# Patient Record
Sex: Female | Born: 1965 | Race: White | Hispanic: No | Marital: Married | Smoking: Never smoker
Health system: Southern US, Community
[De-identification: ages and names within clinical notes are randomized; demographics above are authoritative.]

## PROBLEM LIST (undated history)

## (undated) DIAGNOSIS — F419 Anxiety disorder, unspecified: Secondary | ICD-10-CM

## (undated) DIAGNOSIS — R21 Rash and other nonspecific skin eruption: Secondary | ICD-10-CM

## (undated) DIAGNOSIS — R519 Headache, unspecified: Secondary | ICD-10-CM

## (undated) DIAGNOSIS — G4700 Insomnia, unspecified: Secondary | ICD-10-CM

## (undated) DIAGNOSIS — M25474 Effusion, right foot: Secondary | ICD-10-CM

## (undated) DIAGNOSIS — K829 Disease of gallbladder, unspecified: Secondary | ICD-10-CM

## (undated) DIAGNOSIS — R079 Chest pain, unspecified: Secondary | ICD-10-CM

## (undated) DIAGNOSIS — F32A Depression, unspecified: Secondary | ICD-10-CM

## (undated) DIAGNOSIS — M25471 Effusion, right ankle: Secondary | ICD-10-CM

## (undated) DIAGNOSIS — F329 Major depressive disorder, single episode, unspecified: Secondary | ICD-10-CM

## (undated) DIAGNOSIS — M2550 Pain in unspecified joint: Secondary | ICD-10-CM

## (undated) DIAGNOSIS — E7800 Pure hypercholesterolemia, unspecified: Secondary | ICD-10-CM

## (undated) DIAGNOSIS — R12 Heartburn: Secondary | ICD-10-CM

## (undated) DIAGNOSIS — L853 Xerosis cutis: Secondary | ICD-10-CM

## (undated) DIAGNOSIS — R5383 Other fatigue: Secondary | ICD-10-CM

## (undated) DIAGNOSIS — M25475 Effusion, left foot: Secondary | ICD-10-CM

## (undated) DIAGNOSIS — R0602 Shortness of breath: Secondary | ICD-10-CM

## (undated) DIAGNOSIS — M25472 Effusion, left ankle: Secondary | ICD-10-CM

## (undated) DIAGNOSIS — R002 Palpitations: Secondary | ICD-10-CM

## (undated) DIAGNOSIS — G4730 Sleep apnea, unspecified: Secondary | ICD-10-CM

## (undated) DIAGNOSIS — M25569 Pain in unspecified knee: Secondary | ICD-10-CM

## (undated) DIAGNOSIS — I10 Essential (primary) hypertension: Secondary | ICD-10-CM

## (undated) DIAGNOSIS — E739 Lactose intolerance, unspecified: Secondary | ICD-10-CM

## (undated) DIAGNOSIS — M545 Low back pain, unspecified: Secondary | ICD-10-CM

## (undated) HISTORY — DX: Major depressive disorder, single episode, unspecified: F32.9

## (undated) HISTORY — DX: Rash and other nonspecific skin eruption: R21

## (undated) HISTORY — DX: Headache, unspecified: R51.9

## (undated) HISTORY — DX: Essential (primary) hypertension: I10

## (undated) HISTORY — DX: Disease of gallbladder, unspecified: K82.9

## (undated) HISTORY — DX: Effusion, left ankle: M25.472

## (undated) HISTORY — DX: Depression, unspecified: F32.A

## (undated) HISTORY — DX: Low back pain, unspecified: M54.50

## (undated) HISTORY — DX: Effusion, left foot: M25.475

## (undated) HISTORY — DX: Other fatigue: R53.83

## (undated) HISTORY — DX: Insomnia, unspecified: G47.00

## (undated) HISTORY — DX: Pure hypercholesterolemia, unspecified: E78.00

## (undated) HISTORY — DX: Palpitations: R00.2

## (undated) HISTORY — DX: Effusion, right ankle: M25.471

## (undated) HISTORY — DX: Anxiety disorder, unspecified: F41.9

## (undated) HISTORY — DX: Pain in unspecified knee: M25.569

## (undated) HISTORY — DX: Effusion, right foot: M25.474

## (undated) HISTORY — DX: Shortness of breath: R06.02

## (undated) HISTORY — DX: Sleep apnea, unspecified: G47.30

## (undated) HISTORY — DX: Xerosis cutis: L85.3

## (undated) HISTORY — DX: Pain in unspecified joint: M25.50

## (undated) HISTORY — DX: Chest pain, unspecified: R07.9

## (undated) HISTORY — DX: Lactose intolerance, unspecified: E73.9

## (undated) HISTORY — DX: Heartburn: R12

---

## 1998-08-29 ENCOUNTER — Inpatient Hospital Stay (HOSPITAL_COMMUNITY): Admission: AD | Admit: 1998-08-29 | Discharge: 1998-08-29 | Payer: Self-pay | Admitting: Obstetrics & Gynecology

## 1998-09-04 ENCOUNTER — Inpatient Hospital Stay (HOSPITAL_COMMUNITY): Admission: AD | Admit: 1998-09-04 | Discharge: 1998-09-04 | Payer: Self-pay | Admitting: Obstetrics & Gynecology

## 1998-09-05 ENCOUNTER — Inpatient Hospital Stay (HOSPITAL_COMMUNITY): Admission: AD | Admit: 1998-09-05 | Discharge: 1998-09-09 | Payer: Self-pay | Admitting: Obstetrics and Gynecology

## 1998-09-05 ENCOUNTER — Encounter: Payer: Self-pay | Admitting: Obstetrics and Gynecology

## 1998-09-11 ENCOUNTER — Encounter (HOSPITAL_COMMUNITY): Admission: RE | Admit: 1998-09-11 | Discharge: 1998-12-10 | Payer: Self-pay | Admitting: Obstetrics & Gynecology

## 1999-07-27 HISTORY — PX: CHOLECYSTECTOMY: SHX55

## 2000-06-03 ENCOUNTER — Encounter (INDEPENDENT_AMBULATORY_CARE_PROVIDER_SITE_OTHER): Payer: Self-pay | Admitting: *Deleted

## 2000-06-03 ENCOUNTER — Ambulatory Visit (HOSPITAL_COMMUNITY): Admission: RE | Admit: 2000-06-03 | Discharge: 2000-06-04 | Payer: Self-pay

## 2001-11-20 ENCOUNTER — Other Ambulatory Visit: Admission: RE | Admit: 2001-11-20 | Discharge: 2001-11-20 | Payer: Self-pay | Admitting: Obstetrics & Gynecology

## 2002-05-24 ENCOUNTER — Encounter (INDEPENDENT_AMBULATORY_CARE_PROVIDER_SITE_OTHER): Payer: Self-pay

## 2002-05-24 ENCOUNTER — Inpatient Hospital Stay (HOSPITAL_COMMUNITY): Admission: AD | Admit: 2002-05-24 | Discharge: 2002-05-28 | Payer: Self-pay | Admitting: Obstetrics & Gynecology

## 2002-05-29 ENCOUNTER — Encounter: Admission: RE | Admit: 2002-05-29 | Discharge: 2002-06-28 | Payer: Self-pay | Admitting: Obstetrics & Gynecology

## 2002-07-03 ENCOUNTER — Other Ambulatory Visit: Admission: RE | Admit: 2002-07-03 | Discharge: 2002-07-03 | Payer: Self-pay | Admitting: Obstetrics & Gynecology

## 2002-12-05 ENCOUNTER — Emergency Department (HOSPITAL_COMMUNITY): Admission: EM | Admit: 2002-12-05 | Discharge: 2002-12-05 | Payer: Self-pay | Admitting: Emergency Medicine

## 2002-12-05 ENCOUNTER — Encounter: Payer: Self-pay | Admitting: Emergency Medicine

## 2004-04-14 ENCOUNTER — Other Ambulatory Visit: Admission: RE | Admit: 2004-04-14 | Discharge: 2004-04-14 | Payer: Self-pay | Admitting: Obstetrics and Gynecology

## 2005-08-22 ENCOUNTER — Encounter: Payer: Self-pay | Admitting: Internal Medicine

## 2005-09-28 ENCOUNTER — Other Ambulatory Visit: Admission: RE | Admit: 2005-09-28 | Discharge: 2005-09-28 | Payer: Self-pay | Admitting: Obstetrics and Gynecology

## 2006-09-07 ENCOUNTER — Encounter: Admission: RE | Admit: 2006-09-07 | Discharge: 2006-09-07 | Payer: Self-pay | Admitting: Internal Medicine

## 2006-09-12 ENCOUNTER — Ambulatory Visit: Payer: Self-pay | Admitting: Internal Medicine

## 2007-01-18 ENCOUNTER — Encounter: Admission: RE | Admit: 2007-01-18 | Discharge: 2007-01-18 | Payer: Self-pay | Admitting: Obstetrics and Gynecology

## 2007-07-26 ENCOUNTER — Encounter: Admission: RE | Admit: 2007-07-26 | Discharge: 2007-07-26 | Payer: Self-pay | Admitting: Internal Medicine

## 2007-09-13 ENCOUNTER — Ambulatory Visit: Payer: Self-pay | Admitting: Internal Medicine

## 2007-10-02 ENCOUNTER — Ambulatory Visit: Payer: Self-pay

## 2008-01-24 ENCOUNTER — Ambulatory Visit: Payer: Self-pay

## 2008-03-15 ENCOUNTER — Encounter: Admission: RE | Admit: 2008-03-15 | Discharge: 2008-03-15 | Payer: Self-pay | Admitting: Obstetrics and Gynecology

## 2008-09-20 DIAGNOSIS — R079 Chest pain, unspecified: Secondary | ICD-10-CM

## 2008-09-24 ENCOUNTER — Ambulatory Visit: Payer: Self-pay | Admitting: Internal Medicine

## 2008-09-24 ENCOUNTER — Encounter: Payer: Self-pay | Admitting: Internal Medicine

## 2008-09-24 DIAGNOSIS — E663 Overweight: Secondary | ICD-10-CM | POA: Insufficient documentation

## 2008-09-24 DIAGNOSIS — R002 Palpitations: Secondary | ICD-10-CM

## 2009-05-28 ENCOUNTER — Encounter: Admission: RE | Admit: 2009-05-28 | Discharge: 2009-05-28 | Payer: Self-pay | Admitting: Internal Medicine

## 2010-12-08 NOTE — Assessment & Plan Note (Signed)
[facility]                            CARDIOLOGY OFFICE NOTE   KISSA, CAMPOY                   MRN:          [id number]  DATE:09/13/2007                            DOB:          10-Feb-1966    PRIMARY CARE PHYSICIAN:  Dr. Creola Corn.   INTERVAL HISTORY:  Megan Bryant is a 45 year old woman with history of  chronic chest pain, obesity and anxiety, as well as borderline  hyperlipidemia who returns for routine follow-up.  She had Myoview in  March 2005 which showed ejection fraction 79% and no ischemia or  infarct.  We last saw her a year ago when she was having some chest  pain.  We talked about various strategies including catheterization  repeat stress test or cardiac CT.  However, chest pain resolved and we  did not pursue it further.  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  She now has recurrent chest pain and throbbing was  sometimes goes down her left arm.  There is no dyspnea but she does feel  very stressed and fatigued, and has had several panic attacks.   CURRENT MEDICATIONS:  None.   PHYSICAL EXAM:  She is mildly anxious but no acute distress ambulates  around the clinic without respiratory difficulty.  Blood pressure is 118/82, heart rate 91 weights 228.  HEENT is normal.  NECK:  Supple.  No J JVD.  Carotid 2+ bilateral bruits.  There is no  lymphadenopathy or thyromegaly.  CARDIAC:  PMI is nondisplaced.  Regular rate and rhythm.  No murmurs,  rubs or gallops.  LUNGS:  Clear.  ABDOMEN:  Obese, nontender, nondistended.  No hepatosplenomegaly, no  bruits, no masses.  Good bowel sounds.  EXTREMITIES:  Warm with no cyanosis, clubbing or edema.  No rash.  NEURO:  Alert and oriented x3.  Cranial nerves II-XII are intact.  Moves  all four extremities without difficulty.  Affect is pleasant.   EKG shows normal sinus rhythm rate of 91 no ST-T  wave abnormalities.   ASSESSMENT/PLAN:  Chest pain this is mostly atypical but she does have a  strong family history and is growing increasing concerned about the  possibility of underlying coronary disease.  I will plan for an exercise  Myoview to further risk stratify.  Further disposition pending results  of her Myoview.    Bevelyn Buckles. Bensimhon, MD  Electronically Signed   DRB/MedQ  DD: 09/13/2007  DT: 09/14/2007  Job #: [id number]   cc:   Gwen Pounds, MD

## 2010-12-11 NOTE — Op Note (Signed)
[facility]  Patient:    Megan Bryant, Megan Bryant                   MRN: [id number] Proc. Date: 06/03/00 Adm. Date:  [id number] Attending:  Meredith Leeds CC:         Jonelle Sports. Cheryll Cockayne, M.D.   Operative Report  PREOPERATIVE DIAGNOSIS:  Cholelithiasis and chronic cholecystitis.  POSTOPERATIVE DIAGNOSIS:  Cholelithiasis and chronic cholecystitis.  PROCEDURE:  Laparoscopic cholecystectomy.  SURGEON:  Zigmund Daniel, M.D.  ASSISTANT:  Abigail Miyamoto, M.D.  ANESTHESIA:  General.  DESCRIPTION OF PROCEDURE:  After adequate anesthesia and monitoring and routine preparation and draping of the abdomen, I made a short transverse infraumbilical incision, dissected down to the fascia, opened it longitudinally, opened the peritoneum bluntly, placed an 0 Vicryl pursestring suture, and secured a Hasson cannula.  After inflating the abdomen with CO2, I examined the abdominal contents and saw no abnormalities.  I placed three additional ports and after positioning the patient head up, foot down, and tilted to the left, I retracted the fundus of the gallbladder upward and the infundibulum of the gallbladder laterally.  I dissected out and clearly identified the cystic duct emerging from the infundibulum of the gallbladder, clipped it with four clips, and cut between the two closer to the gallbladder. I dissected out the cystic artery and similarly clipped and divided it, leaving two clips on the remaining stump.  There was one additional small artery, which was I similarly clipped and divided.  I then used spatula and hook cautery devices to dissect the gallbladder from the liver, getting hemostasis with the cautery.  The gallbladder was removed intact, but one small hole was made in it.  For that reason, I put it in a plastic pouch and removed it through the umbilical incision, then tied the pursestring suture. I then again carefully inspected the  operative area and found that the clips were secure and that hemostasis was excellent.  I copiously irrigated the right upper quadrant and removed the irrigant until it was clear and felt there was no further leakage of bile or bleeding.  I then removed the lateral ports, removed the CO2, and then removed the epigastric port.  I anesthetized all the incisions with 0.5% bupivacaine with epinephrine and closed the skin with intracuticular 4-0 Vicryl and Steri-Strips.  The patient tolerated the operation well. DD:  06/03/00 TD:  06/03/00 Job: [id number]

## 2010-12-11 NOTE — Discharge Summary (Signed)
NAME:  Megan Bryant, Megan Bryant                      ACCOUNT NO.:  [id number]   MEDICAL RECORD NO.:  [id number]                   PATIENT TYPE:  INP   LOCATION:  9115                                 FACILITY:  [facility]   PHYSICIAN:  Julio Sicks, N.P.                  DATE OF BIRTH:  Dec 01, 1965   DATE OF ADMISSION:  05/24/2002  DATE OF DISCHARGE:  05/28/2002                                 DISCHARGE SUMMARY   ADMISSION DIAGNOSES:  1. Intrauterine pregnancy at 36-5/7 weeks estimated gestational age.  2. Spontaneous rupture of membranes.  3. Breech presentation.  4. Surgically scarred uterus.  5. Multiparity, desires sterilization.   DISCHARGE DIAGNOSES:  1. Status post cesarean delivery.  2. Viable female infant.  3. Permanent sterilization.   PROCEDURE:  1. Repeat low transverse cesarean delivery.  2. Bilateral tubal ligation.   REASON FOR ADMISSION:  Please see dictated H&P.   HOSPITAL COURSE:  The patient is a 45 year old white married female, gravida  2, para 1 who was admitted at 36-5/7 weeks' estimated gestational age with  spontaneous rupture of membranes with breech presentation.  The patient had  a previous cesarean delivery.  She was admitted for a repeat cesarean  delivery.  The patient also desired bilateral tubal ligation.   The patient was taken to the operating room where spinal anesthesia was  administered without difficulty.  A low transverse cesarean section was made  with delivery of a viable female infant weighing 5 pounds 7 ounces, Apgars 8  at one minute and 9 at five minutes.  Arterial cord pH was 7.34.  Bilateral  tubal ligation was performed.  The patient tolerated the procedure well and  was taken to the recovery room in stable condition.  On postoperative day  #1, the patient complained of a frontal headache which was relieved with  Percocet.  Vital signs were stable.  Blood pressure 110/76.  The patient's  headache was related to sinus congestion.  Zyrtec was  started.  The patient  had good return of bowel function.  Abdominal dressing was clean, dry and  intact.  Labs revealed hemoglobin of 10.5, platelet count of 244,000, WBC  count of 12.2.  On postoperative day #2, vital signs were stable.  Abdominal  dressing was removed, and the incision was noted be clean, dry and intact.  The patient also complained of a shooting pain in her neck with headache and  neck soreness.  On postoperative day #3, the patient was ambulating well.  She continued to have a headache.  She denied numbness or weakness in her  lower extremities.  The pain was relieved with Motrin and Percocet.  Slight  ecchymosis was noted at the spinal injection site, however, without edema,  induration or increase in warmth.  Anesthesia was in to evaluate the  patient.  On postoperative day #4, the patient stated that headache had  resolved.  She continued  to have some neck soreness.  Abdomen was soft.  Incision was clean, dry and intact.  Staples were removed, and the patient  was discharged home.   DISCHARGE CONDITION:  Good.   DISCHARGE INSTRUCTIONS:  Diet regular as tolerated.  Activity:  No heavy  lifting, no vaginal entry.  No driving x2 weeks.   FOLLOW UP:  The patient is to follow up in the office in one to two weeks.  She is to call for temperature greater than 100 degrees, persistent nausea  and vomiting, heavy vaginal bleeding and/or redness from the incisional  site.   DISCHARGE MEDICATIONS:  1. Percocet 5 mg/325 mg #30 one p.o. every four to six hours p.r.n. pain.  2. Motrin 600 mg every six hours p.r.n.  3. Prenatal vitamins one p.o. daily.  4. Colace one p.o. daily p.r.n.                                               Julio Sicks, N.P.    CC/MEDQ  D:  06/17/2002  T:  06/17/2002  Job:  [id number]

## 2010-12-11 NOTE — Op Note (Signed)
NAME:  Megan Bryant, Megan Bryant                      ACCOUNT NO.:  [id number]   MEDICAL RECORD NO.:  [id number]                   PATIENT TYPE:  INP   LOCATION:  NA                                   FACILITY:  [facility]   PHYSICIAN:  Freddy Finner, M.D.                DATE OF BIRTH:  [birth year]   DATE OF PROCEDURE:  05/24/2002  DATE OF DISCHARGE:                                 OPERATIVE REPORT   PREOPERATIVE DIAGNOSES:  1. Intrauterine pregnancy at 79 and five-sevenths weeks gestation.  2. Spontaneous rupture of membranes.  3. Breech presentation.  4. Surgically scarred uterus.   POSTOPERATIVE DIAGNOSES:  1. Intrauterine pregnancy at 58 and five-sevenths weeks gestation.  2. Spontaneous rupture of membranes.  3. Breech presentation.  4. Surgically scarred uterus.  5. Delivery of viable infant, Apgars of 8 and 9, cord pH 7.34.   SECONDARY DIAGNOSIS:  Multiparity and request for surgical sterilization.   SECONDARY PROCEDURE:  Bilateral tubal ligation.   SURGEON:  Freddy Finner, M.D.   ANESTHESIA:  Spinal.   ESTIMATED INTRAOPERATIVE BLOOD LOSS:  600 cc.   INTRAOPERATIVE COMPLICATIONS:  None.   INDICATIONS FOR PROCEDURE:  The patient is a 45 year old who has been  followed through an essentially uneventful pregnancy.  Her first baby was  breech at term and she delivered by cesarean.  This child is also known to  be breech.  She was scheduled for a repeat cesarean section but presented  with rupture of membranes and onset of contractions.  Abdominal exam  confirmed breech presentation.   DESCRIPTION OF PROCEDURE:  The patient was brought to the operating room,  there placed under adequate spinal anesthesia.  Placed in the dorsal  recumbent position with elevation of the right hip by 15 degrees.  The  abdomen was prepped in the usual fashion.  A Foley catheter was placed using  sterile technique.  Sterile drapes were applied.  A lower abdominal  transverse incision was made through  an old scar and carried sharply down to  the fascia.  The fascia was entered sharply and extended to the extent of  the skin incision.  The rectus sheath was developed superiorly.  The rectus  muscles were divided in the midline.  The peritoneum was entered sharply and  extended bluntly to the extent of the skin incision.  A transverse incision  was made in the visceral peritoneum overlying the lower segment and the  bladder bluntly dissected off the lower segment.  A transverse incision was  made in the lower uterine segment and extended bluntly in a transverse  direction.  A viable female infant was then delivered by breech extraction  without difficulty.  Cord was retained for a routine sampling and arterial  cord blood for gases.  Placenta and other parts of conception were removed  from the uterus.  The uterus was delivered through the abdominal incision  and wrapped  in a moist pack.  It appeared to be normal, as were the tubes  and ovaries.  The uterine incision was then closed with a running locking 0  Monocryl suture.  Hemostasis was noted to be adequate.  The mid portion of  each tube was elevated, doubly ligated with three ties of 0 plain, and a  segment of the tube excised, and the distal mucosal surface of the tube  fulgurated.  The uterus was placed back within the abdominal cavity.  Irrigation was carried out.  Hemostatis again was felt to be adequate and  complete.  Needle, sponge, and instrument counts were correct.  The  abdominal incision was then closed in layers; running 0 Monocryl was used to  close the peritoneum and to reapproximate the rectus muscles.  The fascia  was closed with running 0 PDS.  The subcutaneous was closed with a running  suture of 2-0 plain.  The skin was closed with _____ skin staples and Steri-  Strips.  The patient tolerated the operative procedure well and was taken to  recovery in good condition.                                                Freddy Finner, M.D.    WRN/MEDQ  D:  05/24/2002  T:  05/24/2002  Job:  [id number]

## 2010-12-11 NOTE — Assessment & Plan Note (Signed)
[facility]                            CARDIOLOGY OFFICE NOTE   FELISA, ZECHMAN                   MRN:          [id number]  DATE:09/12/2006                            DOB:          [birth year]    REASON FOR CONSULTATION:  Chest pain.   HISTORY OF PRESENT ILLNESS:  Megan Bryant is a delightful 45 year old  woman with a history of chronic chest pain as well as obesity. She also  states that she has borderline hyperlipidemia, but no hypertension. She  has an extensive family history of coronary artery disease. She tells me  that she has had a several year history of chest pain. In March of 2005,  she underwent a treadmill Cardiolite, which showed ejection fraction of  79%. No evidence of ischemia or infarct. There was some mild breast  attenuation. Subsequent to that, she said the chest pain felt somewhat  better. However, the past year, she has recurrent chest and shoulder  pain. She describes this as a throbbing in her upper left chest and  radiating to her shoulder. It can happen at any time, but she says it is  much worse at night. Occasionally, she has an acid taste in the back of  her throat but this is very rare. There is no change with exertion.  There is no associated nausea, vomiting or shortness of breath or  palpitations. She says that given her family history that she gets very  anxious about it and works herself up and is having a hard time  sleeping. She has not had any significant lower extremity edema except  where she broke her ankle many years ago.   REVIEW OF SYSTEMS:  It is notable for anxiety, fatigue and constipation  as well as allergies. No bright red blood per rectum. No melena. No  fevers. No chills. No nausea or vomiting. No abdominal pain. The  remainder of the review of systems is negative except for as per HPI and  problem list.   PROBLEM LIST:  1. Chest pain.      a.     Exercise Cardiolite in March of 2005,  showed an EF of 79%       with no ischemia or infarct.  2. Headaches.  3. Obesity.  4. Anxiety.   CURRENT MEDICATIONS:  Zyrtec p.r.n.   SOCIAL HISTORY:  She is married with two children, 23 and 59 years old.  She works as an Nature conservation officer at [employer]. She goes to [employer]. She denies any tobacco or alcohol use.   FAMILY HISTORY:  Mother is 33 with some mild dementia. Father is alive  at 73. He had a myocardial infarction at 50. He had stents placed in  [place], [place]. Sister is 68 with no coronary disease. Maternal  uncle had heart disease at age 81 and paternal grandfather had heart  disease at 74 and paternal grandfather had heart disease at 46.   PHYSICAL EXAMINATION:  She is a very pleasant woman in no acute  distress. Ambulates around the clinic without any respiratory  difficulty. Blood pressure was 132/68, heart  rate is 85. Weight is 215.  HEENT: Sclerae anicteric. EOMI. There is no xanthelasma. Mucus membranes  are moist. Oropharynx is clear.  NECK: is supple. No JVD. Carotids are 2+ bilaterally without bruits.  There is no lymphadenopathy, thyromegaly.  CARDIAC: Is a regular rate and a rhythm. No murmurs, rubs or gallops.  LUNGS:  Are clear.  ABDOMEN: Obese, nontender, nondistended. No hepatosplenomegaly. No  bruits. No masses appreciated. Good bowel sounds.  EXTREMITIES: Are warm with no cyanosis, clubbing or edema. There are  good distal pulses. No rashes.  NEURO: She is alert and oriented x3. Cranial nerves II-XII are intact.  Moves all 4 extremities without difficulty. Affect is very pleasant.   EKG shows normal  sinus rhythm at a rate of 85 with no ST-T-wave  abnormalities.   ASSESSMENT/PLAN:  Chest pain. Despite her extensive family history, I do  suspect this is non-cardiac. However, the stress over it is greatly  interfering with the quality of her life and thus I do think we need to  be more definitive about providing her with a diagnosis. We  discussed  the possible options including repeating her stress test or proceeding  with cardiac CT or cardiac catheterization. She would like to think over  these options. I outlined the risks and benefits of each approach in  detail. I also think that there may be a component of gastroesophageal  reflux disease and in the interim we will start her on a proton pump  inhibitor. She will go home and discuss the options with her husband.  Should her pain not be resolved in one week after the proton pump  inhibitor initiation, she will get back to me and we will discuss how  she would like to proceed with her testing.   We will see her back in 1 month for routine followup.     Bevelyn Buckles. Bensimhon, MD  Electronically Signed    DRB/MedQ  DD: 09/12/2006  DT: 09/12/2006  Job #: [id number]   cc:   Gwen Pounds, MD

## 2011-04-21 ENCOUNTER — Other Ambulatory Visit: Payer: Self-pay | Admitting: Internal Medicine

## 2011-04-21 DIAGNOSIS — Z1231 Encounter for screening mammogram for malignant neoplasm of breast: Secondary | ICD-10-CM

## 2011-04-23 ENCOUNTER — Ambulatory Visit
Admission: RE | Admit: 2011-04-23 | Discharge: 2011-04-23 | Disposition: A | Discharge status: Home / Self Care | Payer: Managed Care, Other (non HMO) | Source: Ambulatory Visit | Attending: Internal Medicine | Admitting: Internal Medicine

## 2011-04-23 DIAGNOSIS — Z1231 Encounter for screening mammogram for malignant neoplasm of breast: Secondary | ICD-10-CM

## 2012-11-13 ENCOUNTER — Other Ambulatory Visit: Payer: Self-pay

## 2012-11-13 DIAGNOSIS — Z1231 Encounter for screening mammogram for malignant neoplasm of breast: Secondary | ICD-10-CM

## 2012-12-20 ENCOUNTER — Ambulatory Visit: Payer: Managed Care, Other (non HMO)

## 2013-03-16 ENCOUNTER — Ambulatory Visit
Admission: RE | Admit: 2013-03-16 | Discharge: 2013-03-16 | Disposition: A | Discharge status: Home / Self Care | Payer: Managed Care, Other (non HMO) | Source: Ambulatory Visit

## 2013-03-16 DIAGNOSIS — Z1231 Encounter for screening mammogram for malignant neoplasm of breast: Secondary | ICD-10-CM

## 2014-06-03 ENCOUNTER — Other Ambulatory Visit: Payer: Self-pay

## 2014-06-03 DIAGNOSIS — Z1231 Encounter for screening mammogram for malignant neoplasm of breast: Secondary | ICD-10-CM

## 2014-06-06 ENCOUNTER — Ambulatory Visit
Admission: RE | Admit: 2014-06-06 | Discharge: 2014-06-06 | Disposition: A | Discharge status: Home / Self Care | Payer: Managed Care, Other (non HMO) | Source: Ambulatory Visit

## 2014-06-06 DIAGNOSIS — Z1231 Encounter for screening mammogram for malignant neoplasm of breast: Secondary | ICD-10-CM

## 2014-06-24 ENCOUNTER — Other Ambulatory Visit: Payer: Self-pay | Admitting: Obstetrics and Gynecology

## 2014-06-25 LAB — CYTOLOGY - PAP

## 2015-05-27 ENCOUNTER — Other Ambulatory Visit: Payer: Self-pay

## 2015-05-27 DIAGNOSIS — Z1231 Encounter for screening mammogram for malignant neoplasm of breast: Secondary | ICD-10-CM

## 2015-06-27 ENCOUNTER — Ambulatory Visit
Admission: RE | Admit: 2015-06-27 | Discharge: 2015-06-27 | Disposition: A | Discharge status: Home / Self Care | Payer: BLUE CROSS/BLUE SHIELD | Source: Ambulatory Visit

## 2015-06-27 DIAGNOSIS — Z1231 Encounter for screening mammogram for malignant neoplasm of breast: Secondary | ICD-10-CM

## 2015-12-03 ENCOUNTER — Encounter: Payer: Self-pay | Admitting: Gastroenterology

## 2016-01-23 ENCOUNTER — Ambulatory Visit (AMBULATORY_SURGERY_CENTER): Payer: Self-pay

## 2016-01-23 VITALS — Ht 70.0 in | Wt 222.0 lb

## 2016-01-23 DIAGNOSIS — Z1211 Encounter for screening for malignant neoplasm of colon: Secondary | ICD-10-CM

## 2016-01-23 MED ORDER — NA SULFATE-K SULFATE-MG SULF 17.5-3.13-1.6 GM/177ML PO SOLN: 1.0000 | Freq: Once | ORAL | Status: DC

## 2016-01-23 NOTE — Progress Notes (Signed)
No egg or soy allergy.  No previous complications from anesthesia. No home O2. No diet meds. 

## 2016-01-28 ENCOUNTER — Encounter: Payer: Self-pay | Admitting: Gastroenterology

## 2016-02-06 ENCOUNTER — Encounter: Payer: Self-pay | Admitting: Gastroenterology

## 2016-02-06 ENCOUNTER — Ambulatory Visit (AMBULATORY_SURGERY_CENTER): Payer: Managed Care, Other (non HMO) | Admitting: Gastroenterology

## 2016-02-06 VITALS — BP 120/57 | HR 70 | Temp 98.9°F | Resp 11 | Ht 70.0 in | Wt 222.0 lb

## 2016-02-06 DIAGNOSIS — Z1211 Encounter for screening for malignant neoplasm of colon: Secondary | ICD-10-CM | POA: Diagnosis not present

## 2016-02-06 MED ORDER — SODIUM CHLORIDE 0.9 % IV SOLN: 500.0000 mL | INTRAVENOUS | Status: DC

## 2016-02-06 NOTE — Patient Instructions (Signed)
YOU HAD AN ENDOSCOPIC PROCEDURE TODAY AT THE [facility]:   Refer to the procedure report that was given to you for any specific questions about what was found during the examination.  If the procedure report does not answer your questions, please call your gastroenterologist to clarify.  If you requested that your care partner not be given the details of your procedure findings, then the procedure report has been included in a sealed envelope for you to review at your convenience later.  YOU SHOULD EXPECT: Some feelings of bloating in the abdomen. Passage of more gas than usual.  Walking can help get rid of the air that was put into your GI tract during the procedure and reduce the bloating. If you had a lower endoscopy (such as a colonoscopy or flexible sigmoidoscopy) you may notice spotting of blood in your stool or on the toilet paper. If you underwent a bowel prep for your procedure, you may not have a normal bowel movement for a few days.  Please Note:  You might notice some irritation and congestion in your nose or some drainage.  This is from the oxygen used during your procedure.  There is no need for concern and it should clear up in a day or so.  SYMPTOMS TO REPORT IMMEDIATELY:   Following lower endoscopy (colonoscopy or flexible sigmoidoscopy):  Excessive amounts of blood in the stool  Significant tenderness or worsening of abdominal pains  Swelling of the abdomen that is new, acute  Fever of 100F or higher   For urgent or emergent issues, a gastroenterologist can be reached at any hour by calling [phone].   DIET: Your first meal following the procedure should be a small meal and then it is ok to progress to your normal diet. Heavy or fried foods are harder to digest and may make you feel nauseous or bloated.  Likewise, meals heavy in dairy and vegetables can increase bloating.  Drink plenty of fluids but you should avoid alcoholic beverages for 24  hours.  ACTIVITY:  You should plan to take it easy for the rest of today and you should NOT DRIVE or use heavy machinery until tomorrow (because of the sedation medicines used during the test).    FOLLOW UP: Our staff will call the number listed on your records the next business day following your procedure to check on you and address any questions or concerns that you may have regarding the information given to you following your procedure. If we do not reach you, we will leave a message.  However, if you are feeling well and you are not experiencing any problems, there is no need to return our call.  We will assume that you have returned to your regular daily activities without incident.  If any biopsies were taken you will be contacted by phone or by letter within the next 1-3 weeks.  Please call us at [phone] if you have not heard about the biopsies in 3 weeks.    SIGNATURES/CONFIDENTIALITY: You and/or your care partner have signed paperwork which will be entered into your electronic medical record.  These signatures attest to the fact that that the information above on your After Visit Summary has been reviewed and is understood.  Full responsibility of the confidentiality of this discharge information lies with you and/or your care-partner.  Diverticulosis and high fiber diet information given.  Hemorrhoid and hemorrhoid banding information given.

## 2016-02-06 NOTE — Progress Notes (Signed)
Report to PACU, RN, vss, BBS= Clear.  

## 2016-02-06 NOTE — Progress Notes (Signed)
1045 30 mls mylicon for gas discomfort.

## 2016-02-06 NOTE — Op Note (Signed)
[facility] Patient Name: Megan PhoMichelle Bryant Procedure Date: 02/06/2016 9:50 AM MRN: [id number] Endoscopist: Napoleon FormKavitha V. Wandra Babin , MD Age: 50 Referring MD:  Date of Birth: 10-Mar-1966 Gender: Female Account #: [id number] Procedure:                Colonoscopy Indications:              Screening for colorectal malignant neoplasm, This                            is the patient's first colonoscopy Medicines:                Monitored Anesthesia Care Procedure:                Pre-Anesthesia Assessment:                           - Prior to the procedure, a History and Physical                            was performed, and patient medications and                            allergies were reviewed. The patient's tolerance of                            previous anesthesia was also reviewed. The risks                            and benefits of the procedure and the sedation                            options and risks were discussed with the patient.                            All questions were answered, and informed consent                            was obtained. Prior Anticoagulants: The patient has                            taken no previous anticoagulant or antiplatelet                            agents. ASA Grade Assessment: I - A normal, healthy                            patient. After reviewing the risks and benefits,                            the patient was deemed in satisfactory condition to                            undergo the procedure.  After obtaining informed consent, the colonoscope                            was passed under direct vision. Throughout the                            procedure, the patient's blood pressure, pulse, and                            oxygen saturations were monitored continuously. The                            Model CF-HQ190L [id number]) scope was introduced                            through the anus and advanced  to the the terminal                            ileum, with identification of the appendiceal                            orifice and IC valve. The colonoscopy was performed                            without difficulty. The patient tolerated the                            procedure well. The quality of the bowel                            preparation was excellent. The ileocecal valve,                            appendiceal orifice, and rectum were photographed. Scope In: 10:05:55 AM Scope Out: 10:26:51 AM Scope Withdrawal Time: 0 hours 15 minutes 37 seconds  Total Procedure Duration: 0 hours 20 minutes 56 seconds  Findings:                 The perianal and digital rectal examinations were                            normal.                           A few small-mouthed diverticula were found in the                            sigmoid colon, descending colon and transverse                            colon.                           Non-bleeding internal hemorrhoids were found during  retroflexion. The hemorrhoids were small.                           The exam was otherwise without abnormality. Complications:            No immediate complications. Estimated Blood Loss:     Estimated blood loss: none. Impression:               - Diverticulosis in the sigmoid colon, in the                            descending colon and in the transverse colon.                           - Non-bleeding internal hemorrhoids.                           - The examination was otherwise normal.                           - No specimens collected. Recommendation:           - Patient has a contact number available for                            emergencies. The signs and symptoms of potential                            delayed complications were discussed with the                            patient. Return to normal activities tomorrow.                            Written discharge instructions  were provided to the                            patient.                           - Resume previous diet.                           - Continue present medications.                           - Repeat colonoscopy in 10 years for screening                            purposes.                           - Return to GI clinic PRN. Napoleon Form, MD 02/06/2016 10:31:59 AM This report has been signed electronically.

## 2016-02-09 ENCOUNTER — Telehealth: Payer: Self-pay

## 2016-02-09 NOTE — Telephone Encounter (Signed)
  Follow up Call-  Call back number 02/06/2016  Post procedure Call Back phone  # [phone]  Permission to leave phone message Yes     Patient questions:  Do you have a fever, pain , or abdominal swelling? No. Pain Score  0 *  Have you tolerated food without any problems? Yes.    Have you been able to return to your normal activities? Yes.    Do you have any questions about your discharge instructions: Diet   No. Medications  No. Follow up visit  No.  Do you have questions or concerns about your Care? No.  Actions: * If pain score is 4 or above: No action needed, pain <4.

## 2016-11-23 ENCOUNTER — Other Ambulatory Visit: Payer: Self-pay | Admitting: Internal Medicine

## 2016-11-23 DIAGNOSIS — Z1231 Encounter for screening mammogram for malignant neoplasm of breast: Secondary | ICD-10-CM

## 2016-12-10 ENCOUNTER — Other Ambulatory Visit (HOSPITAL_COMMUNITY): Payer: Self-pay | Admitting: Internal Medicine

## 2016-12-10 ENCOUNTER — Ambulatory Visit: Payer: Managed Care, Other (non HMO)

## 2016-12-10 ENCOUNTER — Other Ambulatory Visit: Payer: Self-pay | Admitting: Internal Medicine

## 2016-12-10 DIAGNOSIS — E786 Lipoprotein deficiency: Secondary | ICD-10-CM

## 2016-12-16 ENCOUNTER — Ambulatory Visit
Admission: RE | Admit: 2016-12-16 | Discharge: 2016-12-16 | Disposition: A | Discharge status: Home / Self Care | Payer: No Typology Code available for payment source | Source: Ambulatory Visit | Attending: Internal Medicine | Admitting: Internal Medicine

## 2016-12-16 DIAGNOSIS — E786 Lipoprotein deficiency: Secondary | ICD-10-CM

## 2017-01-10 ENCOUNTER — Ambulatory Visit
Admission: RE | Admit: 2017-01-10 | Discharge: 2017-01-10 | Disposition: A | Discharge status: Home / Self Care | Payer: Managed Care, Other (non HMO) | Source: Ambulatory Visit | Attending: Internal Medicine | Admitting: Internal Medicine

## 2017-01-10 DIAGNOSIS — Z1231 Encounter for screening mammogram for malignant neoplasm of breast: Secondary | ICD-10-CM

## 2017-12-26 ENCOUNTER — Ambulatory Visit: Payer: 59 | Admitting: Podiatry

## 2017-12-30 ENCOUNTER — Ambulatory Visit (INDEPENDENT_AMBULATORY_CARE_PROVIDER_SITE_OTHER): Payer: 59

## 2017-12-30 ENCOUNTER — Ambulatory Visit (INDEPENDENT_AMBULATORY_CARE_PROVIDER_SITE_OTHER): Payer: 59 | Admitting: Podiatry

## 2017-12-30 ENCOUNTER — Encounter: Payer: Self-pay | Admitting: Podiatry

## 2017-12-30 DIAGNOSIS — M779 Enthesopathy, unspecified: Secondary | ICD-10-CM

## 2017-12-30 DIAGNOSIS — M722 Plantar fascial fibromatosis: Secondary | ICD-10-CM

## 2017-12-30 NOTE — Progress Notes (Signed)
Subjective:   Patient ID: Megan Bryant, female   DOB: 52 y.o.   MRN: [id number]   HPI 52 year old female presents the office today for concerns of bilateral foot pain.  She states that she had orthotics back in 2004 which was beneficial but she has not been wearing them and she feels that her febrile and more the left and she walks.  This is been causing some knee problems.  She did break her right ankle in 2004 and she tore points of ligaments and tendons in her ankle.  She states that she is been some sharp pain that helps her feet at times but she has not had this in several weeks.  This started around Easter.  She denies any recent injury or trauma she denies any increase in swelling or redness.  She states that hurts to stand or walk for long periods of time.  She has no other concerns.  No recent treatment.   Review of Systems  All other systems reviewed and are negative.  Past Medical History:  Diagnosis Date  . Anxiety     Past Surgical History:  Procedure Laterality Date  . CESAREAN SECTION  2000,2003  . CHOLECYSTECTOMY  2001     Current Outpatient Medications:  .  cetirizine (ZYRTEC) 10 MG tablet, Take 10 mg by mouth daily., Disp: , Rfl:   Allergies  Allergen Reactions  . Gabapentin Other (See Comments)    tachycardia  . Sulfonamide Derivatives     Pt doesn't remember  . Penicillins Swelling          Objective:  Physical Exam  General: AAO x3, NAD  Dermatological: Skin is warm, dry and supple bilateral. Nails x 10 are well manicured; remaining integument appears unremarkable at this time. There are no open sores, no preulcerative lesions, no rash or signs of infection present.  Vascular: Dorsalis Pedis artery and Posterior Tibial artery pedal pulses are 2/4 bilateral with immedate capillary fill time.  There is no pain with calf compression, swelling, warmth, erythema.   Neruologic: Grossly intact via light touch bilateral.  Protective threshold with  Semmes Wienstein monofilament intact to all pedal sites bilateral.  Negative Tinel sign.  Musculoskeletal: Decrease in medial arch upon weightbearing.  Ankle, subtalar joint range of motion intact however mildly restricted subtalar range of motion of the right side.  There is no area of tenderness identified today.  There is a dorsal spur present of the medial aspect of the midfoot on the first metatarsal cuneiform joint.  Second toe there is tenderness of the plantar fascia course return sooner but there is no pain today.  No pain in the Achilles tendon this appears to be intact.  There is no edema, erythema, increase in warmth bilaterally.  Muscular strength 5/5 in all groups tested bilateral.  Gait: Unassisted, Nonantalgic.       Assessment:   52 year old female with chronic pressures, tendinitis with mild arthritis     Plan:  -Treatment options discussed including all alternatives, risks, and complications -Etiology of symptoms were discussed -X-rays were obtained and reviewed with the patient.  Osteophytes present on the medial malleolus bilaterally.  Mild arthritic changes present on the right side worse than left.  No evidence of acute fracture. -Discussed new custom orthotics.  Check insurance coverage for her and let her know. -Discussed stretching exercises daily.  We also discussed the change in shoes.  Anti-inflammatories as needed.  Vivi BarrackMatthew R Wagoner DPM

## 2017-12-30 NOTE — Patient Instructions (Signed)

## 2018-01-05 ENCOUNTER — Telehealth: Payer: Self-pay | Admitting: Podiatry

## 2018-01-05 NOTE — Telephone Encounter (Signed)
Left message for pt to call to discuss orthotic coverage per Dr Gabriel RungWagoner's request.

## 2018-02-13 ENCOUNTER — Encounter: Payer: Self-pay | Admitting: Neurology

## 2018-02-14 ENCOUNTER — Ambulatory Visit (INDEPENDENT_AMBULATORY_CARE_PROVIDER_SITE_OTHER): Payer: 59 | Admitting: Neurology

## 2018-02-14 ENCOUNTER — Encounter: Payer: Self-pay | Admitting: Neurology

## 2018-02-14 VITALS — BP 120/76 | HR 70 | Ht 70.0 in | Wt 205.0 lb

## 2018-02-14 DIAGNOSIS — R5383 Other fatigue: Secondary | ICD-10-CM

## 2018-02-14 DIAGNOSIS — R0683 Snoring: Secondary | ICD-10-CM

## 2018-02-14 DIAGNOSIS — F5104 Psychophysiologic insomnia: Secondary | ICD-10-CM | POA: Diagnosis not present

## 2018-02-14 DIAGNOSIS — Z72821 Inadequate sleep hygiene: Secondary | ICD-10-CM | POA: Diagnosis not present

## 2018-02-14 NOTE — Progress Notes (Signed)
SLEEP MEDICINE CLINIC   Provider:  Melvyn Bryant  Megan Bryant, M D  Primary Care Physician:  Megan Bryant, John, MD   Referring Provider: Creola Bryant, John, MD   Chief Complaint  Patient presents with  . New Patient (Initial Visit)    pt alone, rm 10. pt has hard time with falling asleep. she struggles with anxiety and thinks that plays a factor into the difficulty falling asleep. pt states that she wakes up frequently  in the night and uses the restroom. never had a sleep study. she has been told she snores. she has alot on her plate,     HPI:  Megan Bryant is a 52 y.o. female , seen here  in a referral visit  from Dr. Timothy Bryant for evaluation of sleep apnea.   Megan Bryant reports that she doubts she has sleep apnea, she would just be a light sleeper, her spouse reported her to snore but has not mentioned any pauses.  Her husband sleeps in a separate room, snores and has OSA but failed CPAP. She sleeps with the Tv on- has a high level of anxiety, and keeps her from worries.   Chief complaint according to patient : see above  Sleep habits are as follows: she watches TV before she goes to bed and in the bedroom. She will be in bed by 10 pm, but sleeps not earlier than between 11 and midnight.  She considers herself prone to worry and often have fluid ruminating same thoughts.  She also is a light sleeper and somewhat restless.  She has 1 or 2 times nocturia, her bedroom is neither quiet nor dark (TV). Megan Bryant avoids prone sleep sometimes sleeps on her back, but mostly on her sides.  She sleeps with 4 pillows.  On a flat firm mattress.  She dreams but not excessively- no nightmare content. She usually wakes up at 8 AM spontaneously but her sleep is not as refreshing and restoring as she would like it to be.  Sometimes she has palpitations related to anxiety, occasional palpitations have letter to wake up from sleep.  She wakes frequently up with headaches,  headaches can also wake her out of sleep.Dry  mouth.   Sleep medical history and family sleep history:  Father with OSA, no other family member . She has one sister- healthy. The patient had no Tonsillectomy , no ENT surgery and no trauma to head and neck.   Social history: married, 2 children- 6015 and 5889 years old. 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 "Sandwiched " and working full time in [employer].  Non smoker, ETOH- 2-3 a year. Caffeine: iced tea daily- it's decaff. Sodas - used to drink daily, now 4-5 a week, often to combat headaches/ Neck pain.    Review of Systems: Out of a complete 14 system review, the patient complains of only the following symptoms, and all other reviewed systems are negative.  morning headaches, cluster headaches, draft sensitive, no nausea but palpitation. headaches are pulsating.  Anxious, restless, worried.   Epworth score  3 / 24 , Fatigue severity score 42/ 63  , depression score n/a    Social History   Socioeconomic History  . Marital status: Married    Spouse name: Not on file  . Number of children: Not on file  . Years of education: Not on file  . Highest education level: Not on file  Occupational History  . Not on file  Social Needs  .  Financial resource strain: Not on file  . Food insecurity:    Worry: Not on file    Inability: Not on file  . Transportation needs:    Medical: Not on file    Non-medical: Not on file  Tobacco Use  . Smoking status: Never Smoker  . Smokeless tobacco: Never Used  Substance and Sexual Activity  . Alcohol use: No    Alcohol/week: 0.0 oz  . Drug use: No  . Sexual activity: Not on file  Lifestyle  . Physical activity:    Days per week: Not on file    Minutes per session: Not on file  . Stress: Not on file  Relationships  . Social connections:    Talks on phone: Not on file    Gets together: Not on file    Attends religious service: Not on file    Active member of club or organization: Not on file    Attends meetings  of clubs or organizations: Not on file    Relationship status: Not on file  . Intimate partner violence:    Fear of current or ex partner: Not on file    Emotionally abused: Not on file    Physically abused: Not on file    Forced sexual activity: Not on file  Other Topics Concern  . Not on file  Social History Narrative  . Not on file    Family History  Problem Relation Age of Onset  . Alzheimer's disease Mother   . Stroke Mother   . Heart disease Father   . COPD Father   . Diabetes Father   . Atrial fibrillation Father   . Stroke Father   . Diabetes Paternal Grandmother   . Colon cancer Neg Hx     Past Medical History:  Diagnosis Date  . Anxiety   . Depression   . Insomnia     Past Surgical History:  Procedure Laterality Date  . CESAREAN SECTION  2000,2003  . CHOLECYSTECTOMY  2001    Current Outpatient Medications  Medication Sig Dispense Refill  . cetirizine (ZYRTEC) 10 MG tablet Take 10 mg by mouth daily.    . sertraline (ZOLOFT) 50 MG tablet Take 25-50 mg by mouth daily.  3   No current facility-administered medications for this visit.     Allergies as of 02/14/2018 - Review Complete 02/14/2018  Allergen Reaction Noted  . Gabapentin Other (See Comments)   . Sulfonamide derivatives  09/24/2008  . Penicillins Swelling 01/23/2016    Vitals: BP 120/76   Pulse 70   Ht 5\' 10"  (1.778 m)   Wt 205 lb (93 kg)   BMI 29.41 kg/m  Last Weight:  Wt Readings from Last 1 Encounters:  02/14/18 205 lb (93 kg)   ZOX:WRUE mass index is 29.41 kg/m.     Last Height:   Ht Readings from Last 1 Encounters:  02/14/18 5\' 10"  (1.778 m)    Physical exam:  General: The patient is awake, alert and appears not in acute distress. The patient is well groomed. Head: Normocephalic, atraumatic. Neck is supple. Mallampati 5- very exaggerated gag.  neck circumference:15.5 . Nasal airflow patent .Cardiovascular:  Regular rate and rhythm , without  murmurs or carotid bruit, and  without distended neck veins. Respiratory: Lungs are clear to auscultation. Skin:  Without evidence of edema, or rash Trunk: BMI is 29.5 - The patient's posture is erect.   Neurologic exam : The patient is awake and alert, oriented to place and  time.   Memory subjective described as intact.  Attention span & concentration ability appears normal.  Speech is fluent,  Without  dysarthria, dysphonia or aphasia.  Mood and affect are appropriate.  Cranial nerves: Pupils are equal and briskly reactive to light. Funduscopic exam without evidence of pallor or edema. Extraocular movements in vertical and horizontal planes intact and without nystagmus. Visual fields by finger perimetry are intact. Hearing to finger rub intact. Facial sensation intact to fine touch. Facial motor strength is symmetric and tongue and uvula move midline. Shoulder shrug was symmetrical.   Motor exam:   Normal tone, muscle bulk and symmetric strength in all extremities. Sensory:  Fine touch, pinprick and vibration were tested in all extremities. Proprioception tested in the upper extremities was normal. Coordination: Rapid alternating movements in the fingers/hands was normal. Finger-to-nose maneuver  normal without evidence of ataxia, dysmetria or tremor. Gait and station: Patient walks without assistive device .Tandem gait is unfragmented. Turns with 3 Steps. Romberg testing is negative. Deep tendon reflexes: in the upper and lower extremities are symmetric and intact. Babinski maneuver deferred.    Assessment:  After physical and neurologic examination, review of laboratory studies,  Personal review of imaging studies, reports of other /same  Imaging studies, results of polysomnography and / or neurophysiology testing and pre-existing records as far as provided in visit., my assessment is   Likely OSA- by husbands report only when supine, snoring is loudest .   1) Mrs. San has several risk factors for obstructive  sleep apnea including a probable narrow upper airway, and a elevated body mass index her body mass index is just under 30 and by no means is she morbidly obese, but it does affect the risk of snoring.  She is truly not on a lot of medication and just recently started Zoloft.  It is possible that her level of anxiety and by being borage at baseline improves after several weeks or even a couple of months on sertraline.  I would like for her to try melatonin at night as it can help her to reduce her sleep latency and hopefully she can sleep longer through the night.  Melatonin also does not suppress dreams and is not addictive.  I usually recommend a dose of 5 mg or less at night.  My next advises him regarding to sleep hygiene I would like her to make a routine plan when to go to bed and to switch all electronics including the TV off 30 minutes before she starts her 14.  If she has any worries that night any ideas she should commit them to paper and just delegate for the next day I like her to turn AROUND so that they are not visible and all blue or gray light emitting electric devices should not be in the bedroom or covered by a cloth. She does prefer a cooler temperature in her bedroom which is usually helpful for sleep.  I also recommended that my patients take a hot bath or shower before bedtime as the increased core temperature helps to maintain and sustain sleep during the night.  I will give her my insomnia handout as well as the insomnia [facility] which is a 14-day plan to improve sleep and consolidate sleep during the night hours.  2) HST ( AETNA )    The patient was advised of the nature of the diagnosed disorder , the treatment options and the  risks for general health and wellness arising from not treating  the condition.   I spent more than 45  minutes of face to face time with the patient.  Greater than 50% of time was spent in counseling and coordination of care. We have discussed the  diagnosis and differential and I answered the patient's questions.       Megan Novas, MD 02/14/2018, 12:13 PM  Certified in Neurology by ABPN Certified in Sleep Medicine by [facility] [address] [place], [place] [address]

## 2018-02-14 NOTE — Patient Instructions (Signed)
Please remember to try to maintain good sleep hygiene, which means: Keep a regular sleep and wake schedule, try not to exercise or have a meal within 2 hours of your bedtime, try to keep your bedroom conducive for sleep, that is, cool and dark, without light distractors such as an illuminated alarm clock, and refrain from watching TV right before sleep or in the middle of the night and do not keep the TV or radio on during the night. Also, try not to use or play on electronic devices at bedtime, such as your cell phone, tablet PC or laptop. If you like to read at bedtime on an electronic device, try to dim the background light as much as possible. Do not eat in the middle of the night.   We will request a sleep study by HST .    We will look for snoring or sleep apnea.   For chronic insomnia, you are best followed by a psychiatrist and/or sleep psychologist.   We will call you with the sleep study results and make a follow up appointment if needed.

## 2018-03-15 ENCOUNTER — Other Ambulatory Visit: Payer: Self-pay | Admitting: Internal Medicine

## 2018-03-15 DIAGNOSIS — Z1231 Encounter for screening mammogram for malignant neoplasm of breast: Secondary | ICD-10-CM

## 2018-03-24 ENCOUNTER — Ambulatory Visit
Admission: RE | Admit: 2018-03-24 | Discharge: 2018-03-24 | Disposition: A | Discharge status: Home / Self Care | Payer: 59 | Source: Ambulatory Visit | Attending: Internal Medicine | Admitting: Internal Medicine

## 2018-03-24 DIAGNOSIS — Z1231 Encounter for screening mammogram for malignant neoplasm of breast: Secondary | ICD-10-CM

## 2019-05-01 ENCOUNTER — Other Ambulatory Visit: Payer: Self-pay | Admitting: Internal Medicine

## 2019-05-01 DIAGNOSIS — Z1231 Encounter for screening mammogram for malignant neoplasm of breast: Secondary | ICD-10-CM

## 2019-06-18 ENCOUNTER — Ambulatory Visit
Admission: RE | Admit: 2019-06-18 | Discharge: 2019-06-18 | Disposition: A | Discharge status: Home / Self Care | Payer: 59 | Source: Ambulatory Visit | Attending: Internal Medicine | Admitting: Internal Medicine

## 2019-06-18 ENCOUNTER — Other Ambulatory Visit: Payer: Self-pay

## 2019-06-18 DIAGNOSIS — Z1231 Encounter for screening mammogram for malignant neoplasm of breast: Secondary | ICD-10-CM

## 2020-10-06 ENCOUNTER — Other Ambulatory Visit: Payer: Self-pay | Admitting: Internal Medicine

## 2020-10-06 DIAGNOSIS — Z1231 Encounter for screening mammogram for malignant neoplasm of breast: Secondary | ICD-10-CM

## 2020-11-20 ENCOUNTER — Ambulatory Visit (INDEPENDENT_AMBULATORY_CARE_PROVIDER_SITE_OTHER): Payer: 59 | Admitting: Neurology

## 2020-11-20 ENCOUNTER — Encounter: Payer: Self-pay | Admitting: Neurology

## 2020-11-20 VITALS — BP 131/86 | HR 85 | Ht 70.0 in | Wt 238.0 lb

## 2020-11-20 DIAGNOSIS — R002 Palpitations: Secondary | ICD-10-CM | POA: Diagnosis not present

## 2020-11-20 DIAGNOSIS — R0683 Snoring: Secondary | ICD-10-CM

## 2020-11-20 DIAGNOSIS — R5383 Other fatigue: Secondary | ICD-10-CM | POA: Insufficient documentation

## 2020-11-20 DIAGNOSIS — G478 Other sleep disorders: Secondary | ICD-10-CM

## 2020-11-20 DIAGNOSIS — R0689 Other abnormalities of breathing: Secondary | ICD-10-CM

## 2020-11-20 NOTE — Progress Notes (Signed)
SLEEP MEDICINE CLINIC   Provider:  Melvyn Novasarmen  Mercie Balsley, M D  Primary Care Physician:  Creola Cornusso, John, MD   Referring Provider: Creola Cornusso, John, MD   Chief Complaint  Patient presents with  . Follow-up    Pt alone, rm 10. Pt here to discuss completing the SS. She still struggles with getting solid sleep. She had previously came and completed the "sleep bootcamp" she states she avg 4-5 hrs of broken sleep. She has never had a SS. She does snore in sleep and if she wakes up can take a long time before she is able to fall back asleep. Her dad has OSA. She has noticed restlessness in legs.    HPI:  Megan Bryant is a 55 y.o. female , seen here in a referral visit  from Dr. Timothy Lassousso for evaluation of sleep apnea. 11-20-2020. All through COVID she has struggled with not being motivated and getting fragmented sleep only, weight gain during the pandemic. She is very inactive, unmotivated. We ordered a HST almost 3 years ago, the order was never completed.  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 She craves new energy.   She still struggles with getting solid sleep. She had been seen 01-2018, previously  and completed the "sleep bootcamp" and melatonin -  Gets 4-5 hours of broken sleep.  She has never had a SS. She does snore in sleep and if she wakes up can take a long time before she is able to fall back asleep. Her dad has OSA. She has noticed restlessness in legs.   Mrs. Megan Bryant reports that she doubts she has sleep apnea, she would just be a light sleeper, her spouse reported her to snore but has not mentioned any pauses.  Her husband sleeps in a separate room, as she snores loudly and kicks a lot - he has OSA but failed CPAP. She still switches on the TV at night. She sleeps with the Tv on- has a high level of anxiety, and keeps her from worries.   Chief complaint according to patient : see above  Sleep habits are as follows: she watches TV before she goes to bed and in the bedroom. She will be in bed by  10 pm, but sleeps not earlier than between 11 and midnight.  She considers herself prone to worry and often have fluid ruminating same thoughts.  She also is a light sleeper and somewhat restless.   She has 1 or 2 times nocturia, her bedroom is now quieter, cooler, and mostly dark (TV). Mrs. Megan Bryant avoids prone sleep sometimes sleeps on her back, but mostly on her sides.  She sleeps with 4 pillows.  on a flat firm mattress.   She dreams but not excessively- no nightmare content. She usually wakes up at 8 AM spontaneously but her sleep is not as refreshing and restoring as she would like it to be.  Sometimes she has palpitations related to anxiety, occasional palpitations have letter to wake up from sleep.  She wakes frequently up with headaches,  headaches can also wake her out of sleep. She has migraines less since she d/c allergy medication- zyrtec.  Dry mouth is better.   Sleep medical history and family sleep history:  Father with atrial fib and OSA, sister had CHF diastolic, atrial fib- MI.    She has one sister- healthy. The patient had no Tonsillectomy , no ENT surgery and no trauma to head and neck.  Depression and fatigue go hand in hand.  Social history: married, 2 children- 18 , soon to graduate -and 65 years old, graduating [employer].  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  Non smoker, ETOH- 2-3 a year. Caffeine: iced tea daily- it's decaff.  Sodas - used to drink daily, now 4-5 a week, often to combat headaches/ Neck pain.  My next advise was regarding to sleep hygiene I would like her to make a routine plan when to go to bed and to switch all electronics including the TV off 30 minutes before she starts her 14.  If she has any worries that night any ideas she should commit them to paper and just delegate for the next day I like her to turn AROUND so that they are not visible and all blue or gray  light emitting electric devices should not be in the bedroom or covered by a cloth.  She does prefer a cooler temperature in her bedroom which is usually helpful for sleep.  I also recommended that my patients take a hot bath or shower before bedtime as the increased core temperature helps to maintain and sustain sleep during the night.    Review of Systems: Out of a complete 14 system review, the patient complains of only the following symptoms, and all other reviewed systems are negative.  morning headaches, cluster headaches, draft sensitive, no nausea but palpitation. headaches are pulsating.  Anxious, restless, worried.   Epworth score  5 / 24 , but she naps on weekends- feels worse after naps.  Fatigue severity score 56/ 63  , depression score n/a    Social History   Socioeconomic History  . Marital status: Married    Spouse name: Not on file  . Number of children: Not on file  . Years of education: Not on file  . Highest education level: Not on file  Occupational History  . Not on file  Tobacco Use  . Smoking status: Never Smoker  . Smokeless tobacco: Never Used  Substance and Sexual Activity  . Alcohol use: No    Alcohol/week: 0.0 standard drinks  . Drug use: No  . Sexual activity: Not on file  Other Topics Concern  . Not on file  Social History Narrative  . Not on file   Social Determinants of Health   Financial Resource Strain: Not on file  Food Insecurity: Not on file  Transportation Needs: Not on file  Physical Activity: Not on file  Stress: Not on file  Social Connections: Not on file  Intimate Partner Violence: Not on file    Family History  Problem Relation Age of Onset  . Alzheimer's disease Mother   . Stroke Mother   . Heart disease Father   . COPD Father   . Diabetes Father   . Atrial fibrillation Father   . Stroke Father   . Diabetes Paternal Grandmother   . Colon cancer Neg Hx     Past Medical History:  Diagnosis Date  . Anxiety   .  Depression   . Insomnia     Past Surgical History:  Procedure Laterality Date  . CESAREAN SECTION  2000,2003  . CHOLECYSTECTOMY  2001    Current Outpatient Medications  Medication Sig Dispense Refill  . diphenhydrAMINE (BENADRYL) 25 MG tablet Take 25 mg by mouth at bedtime as needed.    . melatonin 5 MG TABS Take 5 mg by mouth at bedtime  as needed.     No current facility-administered medications for this visit.    Allergies as of 11/20/2020 - Review Complete 11/20/2020  Allergen Reaction Noted  . Gabapentin Other (See Comments)   . Sulfonamide derivatives  09/24/2008  . Penicillins Swelling 01/23/2016    Vitals: BP 131/86   Pulse 85   Ht 5\' 10"  (1.778 m)   Wt 238 lb (108 kg)   BMI 34.15 kg/m  Last Weight:  Wt Readings from Last 1 Encounters:  11/20/20 238 lb (108 kg)   11/22/20 mass index is 34.15 kg/m.     Last Height:   Ht Readings from Last 1 Encounters:  11/20/20 5\' 10"  (1.778 m)    Physical exam:  General: The patient is awake, alert and appears not in acute distress. The patient is well groomed. Head: Normocephalic, atraumatic. Neck is supple. Mallampati 5- very exaggerated gag.  neck circumference:15.5 . Nasal airflow patent .Cardiovascular:  Regular rate and rhythm , without  murmurs or carotid bruit, and without distended neck veins. Respiratory: Lungs are clear to auscultation. Skin:  Without evidence of edema, or rash Trunk: BMI is 34. 2 now  up from  29.5 -  Weight gain in pandemic times.. The patient's posture is erect.   Neurologic exam : The patient is awake and alert, oriented to place and time.   Memory subjective described as intact.  Attention span & concentration ability appears normal.  Speech is fluent, without  dysarthria, dysphonia or aphasia.  Mood and affect are depressed, unmotivated.   Cranial nerves: Pupils are equal and briskly reactive to light. Funduscopic exam deferred.  Extraocular movements in vertical and horizontal  planes intact and without nystagmus. Visual fields by finger perimetry are intact. Hearing to finger rub intact. Facial sensation intact to fine touch. Facial motor strength is symmetric and tongue and uvula move midline. Shoulder shrug was symmetrical.   Motor exam:   Normal tone, muscle bulk and symmetric strength in all extremities. Sensory:  Fine touch, pinprick and vibration were tested in all extremities. Proprioception tested in the upper extremities was normal. Coordination: Rapid alternating movements in the fingers/hands was normal. Finger-to-nose maneuver  normal without evidence of ataxia, dysmetria or tremor. Gait and station: Patient walks without assistive device . Tandem gait is unfragmented. Turns with 3 Steps. Romberg testing is negative. Deep tendon reflexes: in the upper and lower extremities are symmetric and intact.  Babinski maneuver deferred.    Assessment:  After physical and neurologic examination, review of laboratory studies,  Personal review of imaging studies, reports of other /same  Imaging studies, results of polysomnography and / or neurophysiology testing and pre-existing records as far as provided in visit., my assessment is   Likely OSA- by husbands report only when supine, snoring is loudest .    1) Mrs. Rutkowski has several risk factors for obstructive sleep apnea including a probable narrow upper airway, and a elevated body mass index her body mass index is now much higher, almost 35 / up from under 30 - it does affect the risk of snoring.   She is truly not on a lot of medication and had before last visit in 2019  recently started Zoloft- now she off- she just didn't like it.   Melatonin also does not suppress dreams and is not addictive.  I usually recommend a dose of 5 mg or less at night.   I will give her my insomnia handout as well as the insomnia [facility] which  is a 14-day plan to improve sleep and consolidate sleep during the night hours.  2) HST (  AETNA )    The patient was advised of the nature of the diagnosed disorder , the treatment options and the  risks for general health and wellness arising from not treating the condition.   I spent more than 25  minutes of face to face time with the patient.  Greater than 50% of time was spent in counseling and coordination of care. We have discussed the diagnosis and differential and I answered the patient's questions.       Melvyn Novas, MD 11/20/2020, 8:54 AM  Certified in Neurology by ABPN Certified in Sleep Medicine by [facility] [address], [address] [place], [place] [address]

## 2020-11-20 NOTE — Patient Instructions (Signed)
Insomnia Insomnia is a sleep disorder that makes it difficult to fall asleep or stay asleep. Insomnia can cause fatigue, low energy, difficulty concentrating, mood swings, and poor performance at work or school. There are three different ways to classify insomnia:  Difficulty falling asleep.  Difficulty staying asleep.  Waking up too early in the morning. Any type of insomnia can be long-term (chronic) or short-term (acute). Both are common. Short-term insomnia usually lasts for three months or less. Chronic insomnia occurs at least three times a week for longer than three months. What are the causes? Insomnia may be caused by another condition, situation, or substance, such as:  Anxiety.  Certain medicines.  Gastroesophageal reflux disease (GERD) or other gastrointestinal conditions.  Asthma or other breathing conditions.  Restless legs syndrome, sleep apnea, or other sleep disorders.  Chronic pain.  Menopause.  Stroke.  Abuse of alcohol, tobacco, or illegal drugs.  Mental health conditions, such as depression.  Caffeine.  Neurological disorders, such as Alzheimer's disease.  An overactive thyroid (hyperthyroidism). Sometimes, the cause of insomnia may not be known. What increases the risk? Risk factors for insomnia include:  Gender. Women are affected more often than men.  Age. Insomnia is more common as you get older.  Stress.  Lack of exercise.  Irregular work schedule or working night shifts.  Traveling between different time zones.  Certain medical and mental health conditions. What are the signs or symptoms? If you have insomnia, the main symptom is having trouble falling asleep or having trouble staying asleep. This may lead to other symptoms, such as:  Feeling fatigued or having low energy.  Feeling nervous about going to sleep.  Not feeling rested in the morning.  Having trouble concentrating.  Feeling irritable, anxious, or depressed. How  is this diagnosed? This condition may be diagnosed based on:  Your symptoms and medical history. Your health care provider may ask about: ? Your sleep habits. ? Any medical conditions you have. ? Your mental health.  A physical exam. How is this treated? Treatment for insomnia depends on the cause. Treatment may focus on treating an underlying condition that is causing insomnia. Treatment may also include:  Medicines to help you sleep.  Counseling or therapy.  Lifestyle adjustments to help you sleep better. Follow these instructions at home: Eating and drinking  Limit or avoid alcohol, caffeinated beverages, and cigarettes, especially close to bedtime. These can disrupt your sleep.  Do not eat a large meal or eat spicy foods right before bedtime. This can lead to digestive discomfort that can make it hard for you to sleep.   Sleep habits  Keep a sleep diary to help you and your health care provider figure out what could be causing your insomnia. Write down: ? When you sleep. ? When you wake up during the night. ? How well you sleep. ? How rested you feel the next day. ? Any side effects of medicines you are taking. ? What you eat and drink.  Make your bedroom a dark, comfortable place where it is easy to fall asleep. ? Put up shades or blackout curtains to block light from outside. ? Use a white noise machine to block noise. ? Keep the temperature cool.  Limit screen use before bedtime. This includes: ? Watching TV. ? Using your smartphone, tablet, or computer.  Stick to a routine that includes going to bed and waking up at the same times every day and night. This can help you fall asleep faster. Consider   making a quiet activity, such as reading, part of your nighttime routine.  Try to avoid taking naps during the day so that you sleep better at night.  Get out of bed if you are still awake after 15 minutes of trying to sleep. Keep the lights down, but try reading or  doing a quiet activity. When you feel sleepy, go back to bed.   General instructions  Take over-the-counter and prescription medicines only as told by your health care provider.  Exercise regularly, as told by your health care provider. Avoid exercise starting several hours before bedtime.  Use relaxation techniques to manage stress. Ask your health care provider to suggest some techniques that may work well for you. These may include: ? Breathing exercises. ? Routines to release muscle tension. ? Visualizing peaceful scenes.  Make sure that you drive carefully. Avoid driving if you feel very sleepy.  Keep all follow-up visits as told by your health care provider. This is important. Contact a health care provider if:  You are tired throughout the day.  You have trouble in your daily routine due to sleepiness.  You continue to have sleep problems, or your sleep problems get worse. Get help right away if:  You have serious thoughts about hurting yourself or someone else. If you ever feel like you may hurt yourself or others, or have thoughts about taking your own life, get help right away. You can go to your nearest emergency department or call:  Your local emergency services (911 in the [place]  A suicide crisis helpline, such as the National Suicide Prevention Lifeline at 1-800-273-8255. This is open 24 hours a day. Summary  Insomnia is a sleep disorder that makes it difficult to fall asleep or stay asleep.  Insomnia can be long-term (chronic) or short-term (acute).  Treatment for insomnia depends on the cause. Treatment may focus on treating an underlying condition that is causing insomnia.  Keep a sleep diary to help you and your health care provider figure out what could be causing your insomnia. This information is not intended to replace advice given to you by your health care provider. Make sure you discuss any questions you have with your health care provider. Document  Revised: 05/22/2020 Document Reviewed: 05/22/2020 Elsevier Patient Education  2021 Elsevier Inc. Quality Sleep Information, Adult Quality sleep is important for your mental and physical health. It also improves your quality of life. Quality sleep means you:  Are asleep for most of the time you are in bed.  Fall asleep within 30 minutes.  Wake up no more than once a night.  Are awake for no longer than 20 minutes if you do wake up during the night. Most adults need 7-8 hours of quality sleep each night. How can poor sleep affect me? If you do not get enough quality sleep, you may have:  Mood swings.  Daytime sleepiness.  Confusion.  Decreased reaction time.  Sleep disorders, such as insomnia and sleep apnea.  Difficulty with: ? Solving problems. ? Coping with stress. ? Paying attention. These issues may affect your performance and productivity at work, school, and at home. Lack of sleep may also put you at higher risk for accidents, suicide, and risky behaviors. If you do not get quality sleep you may also be at higher risk for several health problems, including:  Infections.  Type 2 diabetes.  Heart disease.  High blood pressure.  Obesity.  Worsening of long-term conditions, like arthritis, kidney disease, depression, Parkinson's disease,   and epilepsy. What actions can I take to get more quality sleep?  Stick to a sleep schedule. Go to sleep and wake up at about the same time each day. Do not try to sleep less on weekdays and make up for lost sleep on weekends. This does not work.  Try to get about 30 minutes of exercise on most days. Do not exercise 2-3 hours before going to bed.  Limit naps during the day to 30 minutes or less.  Do not use any products that contain nicotine or tobacco, such as cigarettes or e-cigarettes. If you need help quitting, ask your health care provider.  Do not drink caffeinated beverages for at least 8 hours before going to bed.  Coffee, tea, and some sodas contain caffeine.  Do not drink alcohol close to bedtime.  Do not eat large meals close to bedtime.  Do not take naps in the late afternoon.  Try to get at least 30 minutes of sunlight every day. Morning sunlight is best.  Make time to relax before bed. Reading, listening to music, or taking a hot bath promotes quality sleep.  Make your bedroom a place that promotes quality sleep. Keep your bedroom dark, quiet, and at a comfortable room temperature. Make sure your bed is comfortable. Take out sleep distractions like TV, a computer, smartphone, and bright lights.  If you are lying awake in bed for longer than 20 minutes, get up and do a relaxing activity until you feel sleepy.  Work with your health care provider to treat medical conditions that may affect sleeping, such as: ? Nasal obstruction. ? Snoring. ? Sleep apnea and other sleep disorders.  Talk to your health care provider if you think any of your prescription medicines may cause you to have difficulty falling or staying asleep.  If you have sleep problems, talk with a sleep consultant. If you think you have a sleep disorder, talk with your health care provider about getting evaluated by a specialist.      Where to find more information  National Sleep Foundation website: https://sleepfoundation.org  National Heart, Lung, and Blood Institute (NHLBI): www.nhlbi.nih.gov/files/docs/public/sleep/healthy_sleep.pdf  Centers for Disease Control and Prevention (CDC): www.cdc.gov/sleep/index.html Contact a health care provider if you:  Have trouble getting to sleep or staying asleep.  Often wake up very early in the morning and cannot get back to sleep.  Have daytime sleepiness.  Have daytime sleep attacks of suddenly falling asleep and sudden muscle weakness (narcolepsy).  Have a tingling sensation in your legs with a strong urge to move your legs (restless legs syndrome).  Stop breathing  briefly during sleep (sleep apnea).  Think you have a sleep disorder or are taking a medicine that is affecting your quality of sleep. Summary  Most adults need 7-8 hours of quality sleep each night.  Getting enough quality sleep is an important part of health and well-being.  Make your bedroom a place that promotes quality sleep and avoid things that may cause you to have poor sleep, such as alcohol, caffeine, smoking, and large meals.  Talk to your health care provider if you have trouble falling asleep or staying asleep. This information is not intended to replace advice given to you by your health care provider. Make sure you discuss any questions you have with your health care provider. Document Revised: 10/19/2017 Document Reviewed: 10/19/2017 Elsevier Patient Education  2021 Elsevier Inc.  

## 2020-12-01 ENCOUNTER — Other Ambulatory Visit: Payer: Self-pay

## 2020-12-01 ENCOUNTER — Ambulatory Visit
Admission: RE | Admit: 2020-12-01 | Discharge: 2020-12-01 | Disposition: A | Discharge status: Home / Self Care | Payer: 59 | Source: Ambulatory Visit | Attending: Internal Medicine | Admitting: Internal Medicine

## 2020-12-01 DIAGNOSIS — Z1231 Encounter for screening mammogram for malignant neoplasm of breast: Secondary | ICD-10-CM

## 2020-12-29 ENCOUNTER — Ambulatory Visit (INDEPENDENT_AMBULATORY_CARE_PROVIDER_SITE_OTHER): Payer: 59 | Admitting: Neurology

## 2020-12-29 DIAGNOSIS — R5383 Other fatigue: Secondary | ICD-10-CM

## 2020-12-29 DIAGNOSIS — R002 Palpitations: Secondary | ICD-10-CM

## 2020-12-29 DIAGNOSIS — R0689 Other abnormalities of breathing: Secondary | ICD-10-CM

## 2020-12-29 DIAGNOSIS — G4733 Obstructive sleep apnea (adult) (pediatric): Secondary | ICD-10-CM

## 2020-12-29 DIAGNOSIS — R0683 Snoring: Secondary | ICD-10-CM

## 2020-12-29 DIAGNOSIS — G478 Other sleep disorders: Secondary | ICD-10-CM

## 2021-01-01 NOTE — Progress Notes (Signed)
[facility] SLEEP TEST (by Watch PAT) REPORT  STUDY DATE:12-29-20  DOB: 20-Jan-1966  MRN: [id number]  ORDERING CLINICIAN: Jeannine Boga, MD   REFERRING CLINICIAN: Creola Corn, MD   CLINICAL INFORMATION/HISTORY: Megan Bryant is a 55 y.o. female , seen here in a referral visit  from Dr. Timothy Lasso for evaluation of sleep apnea. 11-20-2020. All through COVID pandemic she has struggled with not being motivated and getting fragmented sleep only, weight gain during the pandemic. She is very inactive, passive and unmotivated.  We ordered a HST almost 3 years ago, the order was never completed. 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 She craves new energy.  Mrs. 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 She still switches on the TV at night. She sleeps with the TV on- has a high level of anxiety, and this distracts her from worried thoughts.  Epworth sleepiness score: 5/24. But FSS at 56/63 points.   BMI: 34.1 kg/m  Neck Circumference: 15.5 "  FINDINGS:   Sleep Summary:   Total Recording Time (hours, min): 10 h 0 min  Total Sleep Time (hours, min):  7  h 18 min   Percent REM (%):    16.77 %   Respiratory Indices:   Calculated pAHI (per hour):  33.9/hour        REM pAHI:    55.2/hour      NREM pAHI: 29.7/hour Supine AHI: 35.4 /hour Non Supine AHI was 23/h.  Oxygen Saturation Statistics:    Oxygen Saturation (%) Mean:    93%  Minimum oxygen saturation (%):          58%  O2 Saturation Range (%):                  58-99%   O2 Saturation (minutes) <89%: 28.7 min  Pulse Rate Statistics:   Pulse Mean (bpm):    72/min    Pulse Range                         54-99/min   IMPRESSION: This HST did indicate the  presence of severe OSA (obstructive sleep apnea) , snoring and hypoxemia at night.  REM AHI is almost doubled over NREM AHI.   RECOMMENDATION: This type and degree of OSA needs PAP, positive airway pressure therapy to correct REM dependent apnea and hypoxemia. This would not be treated by dental device or Inspire device  I will order Auto CPAP 6-16 cm water, 2 cm EPR and heated humidification , mask of patient's choice.    INTERPRETING PHYSICIAN:  Melvyn Novas, MD    [facility] and [facility] Sleep Board certified by The [employer] of Sleep Medicine and Diplomate of the [employer] of Sleep Medicine. Board certified In Neurology through the ABPN, Fellow of the [employer] of Neurology. Medical Director of [employer]. Sleep Summary    Start Study Time: End Study Time: Total Recording Time:  9:39:48 PM 7:40:00 AM 10 h, 0 min  Total Sleep Time % REM of Sleep Time:  7 h, 18 min  16.8       Oxygen Saturation Statistics   Mean: 93 Minimum: 58 Maximum: 99  Mean of Desaturations Nadirs (%):  86  Oxygen Desatur. %:  4-9 10-20 >20 Total  Events Number Total   99  42 59.6 25.3  25 15.1  166 100.0  Oxygen Saturation: <90 <=88 <85 <80 <70  Duration (minutes): Sleep % 33.7 7.7 28.7 19.2 6.6 4.4 10.1 2.3 2.9 0.7      Respiratory Indices     Total Events REM NREM All Night  pRDI: pAHI 3%: ODI 4%: pAHIc 3%: % CSR: pAHI 4%:  283  242  166  3 0.0 195 55.2 55.2 56.1 0.8 36.6 29.7 16.7 0.3 39.7 33.9 23.3 0.4 0.0 27.4      Pulse Rate Statistics during Sleep (BPM)     Mean: 72 Minimum: 54 Maximum: 99       Strongly REM dependent oxygen saturation   Position Supine Prone Right Left Non-Supine  Sleep (min) 284.5 113.0 20.0 20.7 153.7  Sleep % 64.9 25.8 4.6 4.7 35.1  pRDI 40.3 43.2 18.2 26.2 38.5  pAHI 3% 35.4 34.1 18.2 23.3 31.2  ODI 4% 25.0 23.4 18.2 2.9 20.1   Left  Prone Right Supine   Snoring  Statistics Snoring Level (dB) >40 >50 >60 >70 >80 >Threshold (45)  Sleep (min) 324.1 37.0 5.1 0.0 0.0 87.0  Sleep % 74.0 8.4 1.2 0.0 0.0 19.9   Mean: 43 dB

## 2021-01-08 NOTE — Progress Notes (Signed)
IMPRESSION: This HST did indicate the presence of severe OSA (obstructive sleep apnea) , snoring and hypoxemia at night. REM AHI is almost doubled over NREM AHI.  RECOMMENDATION: This type and degree of OSA needs PAP, positive airway pressure therapy to correct REM dependent apnea and hypoxemia. This would not be treated by dental device or Inspire device I will order Auto CPAP 6-16 cm water, 2 cm EPR and heated humidification , mask of patient's choice.   INTERPRETING PHYSICIAN: Melvyn Novas, MD

## 2021-01-08 NOTE — Procedures (Signed)
[facility] SLEEP TEST (by Watch PAT) REPORT  STUDY DATE:12-29-20  DOB: 08/16/1965  MRN: [id number]  ORDERING CLINICIAN: Jeannine Boga, MD   REFERRING CLINICIAN: Creola Corn, MD   CLINICAL INFORMATION/HISTORY: Megan Bryant is a 55 y.o. female , seen here in a referral visit  from Dr. Timothy Bryant for evaluation of sleep apnea. 11-20-2020. All through COVID pandemic she has struggled with not being motivated and getting fragmented sleep only, weight gain during the pandemic. She is very inactive, passive and unmotivated.  We ordered a HST almost 3 years ago, the order was never completed. 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 She craves new energy.  Mrs. 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 She still switches on the TV at night. She sleeps with the TV on- has a high level of anxiety, and this distracts her from worried thoughts.  Epworth sleepiness score: 5/24. But FSS at 56/63 points.   BMI: 34.1 kg/m  Neck Circumference: 15.5 "  FINDINGS:   Sleep Summary:   Total Recording Time (hours, min): 10 h 0 min  Total Sleep Time (hours, min):  7  h 18 min   Percent REM (%):    16.77 %   Respiratory Indices:   Calculated pAHI (per hour):  33.9/hour        REM pAHI:    55.2/hour      NREM pAHI: 29.7/hour Supine AHI: 35.4 /hour Non Supine AHI was 23/h.  Oxygen Saturation Statistics:    Oxygen Saturation (%) Mean:    93%  Minimum oxygen saturation (%):          58%  O2 Saturation Range (%):                  58-99%   O2 Saturation (minutes) <89%: 28.7 min  Pulse Rate Statistics:   Pulse Mean (bpm):    72/min    Pulse Range                         54-99/min   IMPRESSION: This HST did indicate the presence of  severe OSA (obstructive sleep apnea) , snoring and hypoxemia at night.  REM AHI is almost doubled over NREM AHI.   RECOMMENDATION: This type and degree of OSA needs PAP, positive airway pressure therapy to correct REM dependent apnea and hypoxemia. This would not be treated by dental device or Inspire device  I will order Auto CPAP 6-16 cm water, 2 cm EPR and heated humidification , mask of patient's choice.    INTERPRETING PHYSICIAN:  Melvyn Novas, MD    [facility] and [facility] Sleep Board certified by The [employer] of Sleep Medicine and Diplomate of the [employer] of Sleep Medicine. Board certified In Neurology through the ABPN, Fellow of the [employer] of Neurology. Medical Director of [employer]. Sleep Summary    Start Study Time: End Study Time: Total Recording Time:  9:39:48 PM 7:40:00 AM 10 h, 0 min  Total Sleep Time % REM of Sleep Time:  7 h, 18 min  16.8       Oxygen Saturation Statistics   Mean: 93 Minimum: 58 Maximum: 99  Mean of Desaturations Nadirs (%):  86  Oxygen Desatur. %:  4-9 10-20 >20 Total  Events Number Total   99  42 59.6 25.3  25 15.1  166 100.0  Oxygen Saturation: <90 <=88 <85 <80 <70  Duration (minutes): Sleep % 33.7 7.7 28.7 19.2 6.6 4.4 10.1 2.3 2.9 0.7      Respiratory Indices     Total Events REM NREM All Night  pRDI: pAHI 3%: ODI 4%: pAHIc 3%: % CSR: pAHI 4%:  283  242  166  3 0.0 195 55.2 55.2 56.1 0.8 36.6 29.7 16.7 0.3 39.7 33.9 23.3 0.4 0.0 27.4      Pulse Rate Statistics during Sleep (BPM)     Mean: 72 Minimum: 54 Maximum: 99       Strongly REM dependent oxygen saturation   Position Supine Prone Right Left Non-Supine  Sleep (min) 284.5 113.0 20.0 20.7 153.7  Sleep % 64.9 25.8 4.6 4.7 35.1  pRDI 40.3 43.2 18.2 26.2 38.5  pAHI 3% 35.4 34.1 18.2 23.3 31.2  ODI 4% 25.0 23.4 18.2 2.9 20.1   Left  Prone Right Supine   Snoring Statistics Snoring Level  (dB) >40 >50 >60 >70 >80 >Threshold (45)  Sleep (min) 324.1 37.0 5.1 0.0 0.0 87.0  Sleep % 74.0 8.4 1.2 0.0 0.0 19.9

## 2021-01-08 NOTE — Addendum Note (Signed)
Addended by: Melvyn Novas on: 01/08/2021 01:14 PM   Modules accepted: Orders

## 2021-01-12 ENCOUNTER — Telehealth: Payer: Self-pay | Admitting: Neurology

## 2021-01-12 DIAGNOSIS — G4733 Obstructive sleep apnea (adult) (pediatric): Secondary | ICD-10-CM

## 2021-01-12 DIAGNOSIS — R0683 Snoring: Secondary | ICD-10-CM

## 2021-01-12 NOTE — Telephone Encounter (Signed)
-----   Message from Melvyn Novas, MD sent at 01/08/2021  1:14 PM EDT ----- IMPRESSION: This HST did indicate the presence of severe OSA (obstructive sleep apnea) , snoring and hypoxemia at night. REM AHI is almost doubled over NREM AHI.  RECOMMENDATION: This type and degree of OSA needs PAP, positive airway pressure therapy to correct REM dependent apnea and hypoxemia. This would not be treated by dental device or Inspire device I will order Auto CPAP 6-16 cm water, 2 cm EPR and heated humidification , mask of patient's choice.   INTERPRETING PHYSICIAN: Melvyn Novas, MD

## 2021-01-12 NOTE — Telephone Encounter (Signed)
Called patient to discuss sleep study results. No answer at this time. LVM for the patient to call back.   

## 2021-01-14 ENCOUNTER — Encounter: Payer: Self-pay | Admitting: Neurology

## 2021-01-14 NOTE — Telephone Encounter (Signed)
Made a 2nd attempt to call the patient. I will also send a my chart message.

## 2021-01-16 NOTE — Telephone Encounter (Signed)
Pt returned call. Please call back Monday.

## 2021-01-19 ENCOUNTER — Encounter: Payer: Self-pay | Admitting: *Deleted

## 2021-01-19 NOTE — Addendum Note (Signed)
Addended by: Arther Abbott on: 01/19/2021 09:48 AM   Modules accepted: Orders

## 2021-01-19 NOTE — Telephone Encounter (Signed)
Called pt. Went over sleep study results per [employer]. Pt agreeable to try autopap cpap. Orders placed, letter sent to pt. Community message sent to [employer] that order was placed.

## 2021-02-19 ENCOUNTER — Encounter: Payer: Self-pay | Admitting: Family Medicine

## 2021-02-19 ENCOUNTER — Other Ambulatory Visit: Payer: Self-pay

## 2021-02-19 ENCOUNTER — Ambulatory Visit (INDEPENDENT_AMBULATORY_CARE_PROVIDER_SITE_OTHER): Payer: 59 | Admitting: Family Medicine

## 2021-02-19 VITALS — BP 115/69 | HR 70 | Ht 70.0 in | Wt 228.0 lb

## 2021-02-19 DIAGNOSIS — Z9989 Dependence on other enabling machines and devices: Secondary | ICD-10-CM

## 2021-02-19 DIAGNOSIS — G4733 Obstructive sleep apnea (adult) (pediatric): Secondary | ICD-10-CM | POA: Diagnosis not present

## 2021-02-19 NOTE — Patient Instructions (Addendum)
Please continue using your CPAP regularly. While your insurance requires that you use CPAP at least 4 hours each night on 70% of the nights, I recommend, that you not skip any nights and use it throughout the night if you can. Getting used to CPAP and staying with the treatment long term does take time and patience and discipline. Untreated obstructive sleep apnea when it is moderate to severe can have an adverse impact on cardiovascular health and raise her risk for heart disease, arrhythmias, hypertension, congestive heart failure, stroke and diabetes. Untreated obstructive sleep apnea causes sleep disruption, nonrestorative sleep, and sleep deprivation. This can have an impact on your day to day functioning and cause daytime sleepiness and impairment of cognitive function, memory loss, mood disturbance, and problems focussing. Using CPAP regularly can improve these symptoms.   Once I get data, I will call you to discuss. Continue working with [employer] for any mask concerns.

## 2021-02-19 NOTE — Progress Notes (Signed)
PATIENT: AREEJ TAYLER DOB: 04/21/1966  REASON FOR VISIT: follow up HISTORY FROM: patient  Chief Complaint  Patient presents with   Obstructive Sleep Apnea    Rm, alone, c/o mask not fitting      HISTORY OF PRESENT ILLNESS: 02/19/21 ALL:  KASMIRA CACIOPPO is a 55 y.o. female patient of Dr Dohmeier here today for follow up for OSA on CPAP.  HST showed severe OSA with AHI of 33.9/hr and O2 nadir of 58%. She feels that she is doing well with CPAP. She is not tossing and turning as much. She does feel that she is having more pain due to being more still. She is getting more sleep. She is going to bed sooner and waking a little later. She is using Airtouch F20 M FFM  but concerned about possible air leak.   Unfortunately, we do not have any data to review for today's visit. She has an [employer] Higher education careers adviser. Set up date 02/07/21.    HISTORY: (copied from Dr Dohmeier's previous note)  HPI:  KENYONA RENA is a 55 y.o. female , seen here in a referral visit  from Dr. Timothy Lasso for evaluation of sleep apnea. 11-20-2020. All through COVID she has struggled with not being motivated and getting fragmented sleep only, weight gain during the pandemic. She is very inactive, unmotivated. We ordered a HST almost 3 years ago, the order was never completed. 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 She craves new energy.    She still struggles with getting solid sleep. She had been seen 01-2018, previously  and completed the "sleep bootcamp" and melatonin -  Gets 4-5 hours of broken sleep. She has never had a SS. She does snore in sleep and if she wakes up can take a long time before she is able to fall back asleep. Her dad has OSA. She has noticed restlessness in legs.    Mrs. Knowles reports that she doubts she has sleep apnea, she would just be a light sleeper, her spouse reported her to snore but has not mentioned any pauses. Her husband sleeps in a separate room, as she snores loudly and kicks a  lot - he has OSA but failed CPAP. She still switches on the TV at night. She sleeps with the Tv on- has a high level of anxiety, and keeps her from worries.   Chief complaint according to patient : see above   Sleep habits are as follows: she watches TV before she goes to bed and in the bedroom. She will be in bed by 10 pm, but sleeps not earlier than between 11 and midnight.  She considers herself prone to worry and often have fluid ruminating same thoughts.  She also is a light sleeper and somewhat restless.  She has 1 or 2 times nocturia, her bedroom is now quieter, cooler, and mostly dark (TV). Mrs. Hegler avoids prone sleep sometimes sleeps on her back, but mostly on her sides.  She sleeps with 4 pillows.  on a flat firm mattress.   She dreams but not excessively- no nightmare content. She usually wakes up at 8 AM spontaneously but her sleep is not as refreshing and restoring as she would like it to be.  Sometimes she has palpitations related to anxiety, occasional palpitations have letter to wake up from sleep.  She wakes frequently up with headaches,  headaches can also wake her out of sleep. She has migraines less since she d/c allergy medication- zyrtec.  Dry mouth is better.   Sleep medical history and family sleep history:  Father with atrial fib and OSA, sister had CHF diastolic, atrial fib- MI.    She has one sister- healthy. The patient had no Tonsillectomy , no ENT surgery and no trauma to head and neck.  Depression and fatigue go hand in hand.    Social history: married, 2 children- 18 , soon to graduate -and 36 years old, graduating [employer]. 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 Non smoker, ETOH- 2-3 a year. Caffeine: iced tea daily- it's decaff. Sodas - used to drink daily, now 4-5 a week, often to combat headaches/ Neck pain. My next advise was regarding to sleep hygiene I would like her  to make a routine plan when to go to bed and to switch all electronics including the TV off 30 minutes before she starts her 14.  If she has any worries that night any ideas she should commit them to paper and just delegate for the next day I like her to turn AROUND so that they are not visible and all blue or gray light emitting electric devices should not be in the bedroom or covered by a cloth.   She does prefer a cooler temperature in her bedroom which is usually helpful for sleep.  I also recommended that my patients take a hot bath or shower before bedtime as the increased core temperature helps to maintain and sustain sleep during the night.   REVIEW OF SYSTEMS: Out of a complete 14 system review of symptoms, the patient complains only of the following symptoms, hip pain and all other reviewed systems are negative.  ESS: 6/24, previously 5/24 FSS: not completed   ALLERGIES: Allergies  Allergen Reactions   Gabapentin Other (See Comments)    tachycardia   Sulfonamide Derivatives     Pt doesn't remember   Penicillins Swelling    HOME MEDICATIONS: Outpatient Medications Prior to Visit  Medication Sig Dispense Refill   diphenhydrAMINE (BENADRYL) 25 MG tablet Take 25 mg by mouth at bedtime as needed.     melatonin 5 MG TABS Take 5 mg by mouth at bedtime as needed.     No facility-administered medications prior to visit.    PAST MEDICAL HISTORY: Past Medical History:  Diagnosis Date   Anxiety    Depression    Insomnia     PAST SURGICAL HISTORY: Past Surgical History:  Procedure Laterality Date   CESAREAN SECTION  2000,2003   CHOLECYSTECTOMY  2001    FAMILY HISTORY: Family History  Problem Relation Age of Onset   Alzheimer's disease Mother    Stroke Mother    Heart disease Father    COPD Father    Diabetes Father    Atrial fibrillation Father    Stroke Father    Diabetes Paternal Grandmother    Colon cancer Neg Hx     SOCIAL HISTORY: Social History    Socioeconomic History   Marital status: Married    Spouse name: Not on file   Number of children: Not on file   Years of education: Not on file   Highest education level: Not on file  Occupational History   Not on file  Tobacco Use   Smoking status: Never   Smokeless tobacco: Never  Substance and Sexual Activity   Alcohol use: No    Alcohol/week: 0.0 standard drinks  Drug use: No   Sexual activity: Not on file  Other Topics Concern   Not on file  Social History Narrative   Not on file   Social Determinants of Health   Financial Resource Strain: Not on file  Food Insecurity: Not on file  Transportation Needs: Not on file  Physical Activity: Not on file  Stress: Not on file  Social Connections: Not on file  Intimate Partner Violence: Not on file     PHYSICAL EXAM  Vitals:   02/19/21 0931  BP: 115/69  Pulse: 70  Weight: 228 lb (103.4 kg)  Height: 5\' 10"  (1.778 m)   Body mass index is 32.71 kg/m.  Generalized: Well developed, in no acute distress  Cardiology: normal rate and rhythm, no murmur noted Respiratory: clear to auscultation bilaterally  Neurological examination  Mentation: Alert oriented to time, place, history taking. Follows all commands speech and language fluent Cranial nerve II-XII: Pupils were equal round reactive to light. Extraocular movements were full, visual field were full  Motor: The motor testing reveals 5 over 5 strength of all 4 extremities. Good symmetric motor tone is noted throughout.  Gait and station: Gait is normal.    DIAGNOSTIC DATA (LABS, IMAGING, TESTING) - I reviewed patient records, labs, notes, testing and imaging myself where available.  No flowsheet data found.   No results found for: WBC, HGB, HCT, MCV, PLT No results found for: NA, K, CL, CO2, GLUCOSE, BUN, CREATININE, CALCIUM, PROT, ALBUMIN, AST, ALT, ALKPHOS, BILITOT, GFRNONAA, GFRAA No results found for: CHOL, HDL, LDLCALC, LDLDIRECT, TRIG, CHOLHDL No results  found for: HGBA1C No results found for: VITAMINB12 No results found for: TSH   ASSESSMENT AND PLAN 55 y.o. year old female  has a past medical history of Anxiety, Depression, and Insomnia. here with     ICD-10-CM   1. OSA on CPAP  G47.33    Z99.89         JENTRY MCQUEARY is doing well on CPAP therapy. She does feel that she is resting better and sleeping longer than she has. She does report more hip pain since lying more still at night and is addressing with PCP. Unfortuantely, we do not have any data to evaluated from her SD card. We will work with [facility] to locate her data and evaluate. We will adjust care accordingly based on compliance report. She was encouraged to continue using CPAP nightly and for greater than 4 hours each night. She will call [employer] to have mask fit assessed. Risks of untreated sleep apnea review and education materials provided. Healthy lifestyle habits encouraged. She will follow up pending review of download. She verbalizes understanding and agreement with this plan.    No orders of the defined types were placed in this encounter.    No orders of the defined types were placed in this encounter.     Cammy Copa, FNP-C 02/19/2021, 9:41 AM [facility] [address] [place], [place] [phone]

## 2021-04-06 ENCOUNTER — Telehealth: Payer: Self-pay | Admitting: Family Medicine

## 2021-04-06 NOTE — Telephone Encounter (Signed)
Spoke with patient and advised we are now able to see her profile on the [employer] portal however card download is needed to see data. Patient stated she would bring her card by on Thursday 9/15 around 10 AM.

## 2021-04-06 NOTE — Telephone Encounter (Signed)
Pt called states when she was in the office in July [facility] had not given doctor access to see pt's progress on her CPAP machine. Pt states doctor still hasn't seen her progress to know if the machine is working for her. Pt requesting a call back.

## 2021-04-13 NOTE — Telephone Encounter (Signed)
Pt scheduled for VV with Amy on 04/28/21.

## 2021-04-13 NOTE — Telephone Encounter (Signed)
Pt brought her machine in and download was received. She has been compliant. She was set up on [employer] machine 01/28/21. This means her first visit must have taken place 8/7-10/6/22. We will see if we can get a cpap follow up visit scheduled even if mychart VV just to at least document compliance for insurance reasons.

## 2021-04-27 NOTE — Progress Notes (Addendum)
PATIENT: Megan Bryant DOB: [birth year]  REASON FOR VISIT: follow up HISTORY FROM: patient  Virtual Visit via Telephone Note  I connected with Megan Bryant on 04/28/2021 at  9:15 AM EDT by telephone and verified that I am speaking with the correct person using two identifiers.   I discussed the limitations, risks, security and privacy concerns of performing an evaluation and management service by telephone and the availability of in person appointments. I also discussed with the patient that there may be a patient responsible charge related to this service. The patient expressed understanding and agreed to proceed.   History of Present Illness:  04/28/2021 ALL: Megan Bryant is a 55 y.o. female here today for follow up for initial CPAP. She was seen 02/19/2021 but visit was too early to qualify for compliance review per insurance. She continues to do well on CPAP. She does have nights where she wakes to use the restroom and can not get back to sleep with therapy. She alternates melatonin 5mg  and benadryl 25mg  which seems to help. She has had more allergy symptoms that has limited 4 hour use over the past few weeks. She feels that current mask is the best fit for her. She does occasionally have a little air leak. She does feel better rested. She feels that she is waking earlier and ready to get up.   Compliance report dated 03/09/2021-04/07/2021 shows she used CPAP 30/30 days for compliance of 100%. She used CPAP greater than 4 hours 24/30 days for compliance of 80%. Average usage was 5.6hr. Residual AHI was 0.6/hr on 6-16cmH20. No significant leak noted.   02/19/21 ALL:  Megan Bryant is a 56 y.o. female patient of Dr Megan Bryant here today for follow up for OSA on CPAP.  HST showed severe OSA with AHI of 33.9/hr and O2 nadir of 58%. She feels that she is doing well with CPAP. She is not tossing and turning as much. She does feel that she is having more pain due to being more  still. She is getting more sleep. She is going to bed sooner and waking a little later. She is using Airtouch F20 M FFM  but concerned about possible air leak.    Observations/Objective:  Generalized: Well developed, in no acute distress  Mentation: Alert oriented to time, place, history taking. Follows all commands speech and language fluent   Assessment and Plan:  55 y.o. year old female  has a past medical history of Anxiety, Depression, and Insomnia. here with    ICD-10-CM   1. OSA on CPAP  G47.33    Z99.89       Megan Bryant continues to do well on CPAP therapy. Compliance report shows excellent daily compliance and optimal 4 hour usage. She was encouraged to use CPAP nighty for at least 4 hours. She will continue healthy lifestyle habits. She will follow up in 1 year, sooner if needed. She verbalizes understanding and agreement with this plan.   No orders of the defined types were placed in this encounter.   No orders of the defined types were placed in this encounter.    Follow Up Instructions:  I discussed the assessment and treatment plan with the patient. The patient was provided an opportunity to ask questions and all were answered. The patient agreed with the plan and demonstrated an understanding of the instructions.   The patient was advised to call back or seek an in-person evaluation if the symptoms worsen or if the  condition fails to improve as anticipated.  I provided 15 minutes of non-face-to-face time during this encounter. Patient located at their place of residence during Mychart visit. Provider is in the office.    Shawnie Dapper, NP

## 2021-04-27 NOTE — Patient Instructions (Signed)

## 2021-04-28 ENCOUNTER — Encounter: Payer: Self-pay | Admitting: Family Medicine

## 2021-04-28 ENCOUNTER — Telehealth (INDEPENDENT_AMBULATORY_CARE_PROVIDER_SITE_OTHER): Payer: 59 | Admitting: Family Medicine

## 2021-04-28 DIAGNOSIS — G4733 Obstructive sleep apnea (adult) (pediatric): Secondary | ICD-10-CM

## 2021-04-28 DIAGNOSIS — Z9989 Dependence on other enabling machines and devices: Secondary | ICD-10-CM | POA: Diagnosis not present

## 2021-08-31 DIAGNOSIS — Z780 Asymptomatic menopausal state: Secondary | ICD-10-CM | POA: Diagnosis not present

## 2021-08-31 DIAGNOSIS — G4733 Obstructive sleep apnea (adult) (pediatric): Secondary | ICD-10-CM | POA: Diagnosis not present

## 2021-09-28 DIAGNOSIS — G4733 Obstructive sleep apnea (adult) (pediatric): Secondary | ICD-10-CM | POA: Diagnosis not present

## 2021-10-22 DIAGNOSIS — E538 Deficiency of other specified B group vitamins: Secondary | ICD-10-CM | POA: Diagnosis not present

## 2021-10-29 DIAGNOSIS — L309 Dermatitis, unspecified: Secondary | ICD-10-CM | POA: Diagnosis not present

## 2021-10-29 DIAGNOSIS — M1990 Unspecified osteoarthritis, unspecified site: Secondary | ICD-10-CM | POA: Diagnosis not present

## 2021-10-29 DIAGNOSIS — E786 Lipoprotein deficiency: Secondary | ICD-10-CM | POA: Diagnosis not present

## 2021-10-29 DIAGNOSIS — Z0000 Encounter for general adult medical examination without abnormal findings: Secondary | ICD-10-CM | POA: Diagnosis not present

## 2021-10-29 DIAGNOSIS — E669 Obesity, unspecified: Secondary | ICD-10-CM | POA: Diagnosis not present

## 2021-10-29 DIAGNOSIS — R69 Illness, unspecified: Secondary | ICD-10-CM | POA: Diagnosis not present

## 2021-10-29 DIAGNOSIS — R82998 Other abnormal findings in urine: Secondary | ICD-10-CM | POA: Diagnosis not present

## 2021-10-29 DIAGNOSIS — G4733 Obstructive sleep apnea (adult) (pediatric): Secondary | ICD-10-CM | POA: Diagnosis not present

## 2021-10-29 DIAGNOSIS — K219 Gastro-esophageal reflux disease without esophagitis: Secondary | ICD-10-CM | POA: Diagnosis not present

## 2021-11-28 DIAGNOSIS — G4733 Obstructive sleep apnea (adult) (pediatric): Secondary | ICD-10-CM | POA: Diagnosis not present

## 2021-12-19 DIAGNOSIS — M545 Low back pain, unspecified: Secondary | ICD-10-CM | POA: Diagnosis not present

## 2021-12-29 DIAGNOSIS — G4733 Obstructive sleep apnea (adult) (pediatric): Secondary | ICD-10-CM | POA: Diagnosis not present

## 2022-01-04 DIAGNOSIS — G4733 Obstructive sleep apnea (adult) (pediatric): Secondary | ICD-10-CM | POA: Diagnosis not present

## 2022-01-28 DIAGNOSIS — G4733 Obstructive sleep apnea (adult) (pediatric): Secondary | ICD-10-CM | POA: Diagnosis not present

## 2022-02-03 DIAGNOSIS — G4733 Obstructive sleep apnea (adult) (pediatric): Secondary | ICD-10-CM | POA: Diagnosis not present

## 2022-02-18 DIAGNOSIS — R399 Unspecified symptoms and signs involving the genitourinary system: Secondary | ICD-10-CM | POA: Diagnosis not present

## 2022-02-24 NOTE — Patient Instructions (Signed)

## 2022-02-24 NOTE — Progress Notes (Unsigned)
PATIENT: Megan Bryant DOB: 01/02/1966  REASON FOR VISIT: follow up HISTORY FROM: patient  No chief complaint on file.    HISTORY OF PRESENT ILLNESS:  02/24/22 ALL:  Davida returns for follow up for OSA on CPAP.   04/28/2021 ALL (Mychart): Megan Bryant is a 56 y.o. female here today for follow up for initial CPAP. She was seen 02/19/2021 but visit was too early to qualify for compliance review per insurance. She continues to do well on CPAP. She does have nights where she wakes to use the restroom and can not get back to sleep with therapy. She alternates melatonin 5mg  and benadryl 25mg  which seems to help. She has had more allergy symptoms that has limited 4 hour use over the past few weeks. She feels that current mask is the best fit for her. She does occasionally have a little air leak. She does feel better rested. She feels that she is waking earlier and ready to get up.    Compliance report dated 03/09/2021-04/07/2021 shows she used CPAP 30/30 days for compliance of 100%. She used CPAP greater than 4 hours 24/30 days for compliance of 80%. Average usage was 5.6hr. Residual AHI was 0.6/hr on 6-16cmH20. No significant leak noted.   02/19/2021 ALL: Megan Bryant is a 56 y.o. female patient of Dr Dohmeier here today for follow up for OSA on CPAP.  HST showed severe OSA with AHI of 33.9/hr and O2 nadir of 58%. She feels that she is doing well with CPAP. She is not tossing and turning as much. She does feel that she is having more pain due to being more still. She is getting more sleep. She is going to bed sooner and waking a little later. She is using Airtouch F20 M FFM  but concerned about possible air leak.   Unfortunately, we do not have any data to review for today's visit. She has an ibreeze Cammy Copa. Set up date 02/07/21.    HISTORY: (copied from Dr Dohmeier's previous note)  HPI:  Megan Bryant is a 56 y.o. female , seen here in a referral visit  from  Dr. Cammy Copa for evaluation of sleep apnea. 11-20-2020. All through COVID she has struggled with not being motivated and getting fragmented sleep only, weight gain during the pandemic. She is very inactive, unmotivated. We ordered a HST almost 3 years ago, the order was never completed. She works form home in [employer]. She craves new energy.    She still struggles with getting solid sleep. She had been seen 01-2018, previously  and completed the "sleep bootcamp" and melatonin -  Gets 4-5 hours of broken sleep. She has never had a SS. She does snore in sleep and if she wakes up can take a long time before she is able to fall back asleep. Her dad has OSA. She has noticed restlessness in legs.    Megan Bryant reports that she doubts she has sleep apnea, she would just be a light sleeper, her spouse reported her to snore but has not mentioned any pauses. Her husband sleeps in a separate room, as she snores loudly and kicks a lot - he has OSA but failed CPAP. She still switches on the TV at night. She sleeps with the Tv on- has a high level of anxiety, and keeps her from worries.   Chief complaint according to patient : see above   Sleep habits are as follows: she watches TV before she goes  to bed and in the bedroom. She will be in bed by 10 pm, but sleeps not earlier than between 11 and midnight.  She considers herself prone to worry and often have fluid ruminating same thoughts.  She also is a light sleeper and somewhat restless.  She has 1 or 2 times nocturia, her bedroom is now quieter, cooler, and mostly dark (TV). Megan Bryant avoids prone sleep sometimes sleeps on her back, but mostly on her sides.  She sleeps with 4 pillows.  on a flat firm mattress.   She dreams but not excessively- no nightmare content. She usually wakes up at 8 AM spontaneously but her sleep is not as refreshing and restoring as she would like it to be.  Sometimes she has palpitations related to anxiety, occasional palpitations  have letter to wake up from sleep.  She wakes frequently up with headaches,  headaches can also wake her out of sleep. She has migraines less since she d/c allergy medication- zyrtec.  Dry mouth is better.   Sleep medical history and family sleep history:  Father with atrial fib and OSA, sister had CHF diastolic, atrial fib- MI.    She has one sister- healthy. The patient had no Tonsillectomy , no ENT surgery and no trauma to head and neck.  Depression and fatigue go hand in hand.    Social history: married, 2 children- 18 , soon to graduate -and 88 years old, graduating [employer]. 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 Non smoker, ETOH- 2-3 a year. Caffeine: iced tea daily- it's decaff. Sodas - used to drink daily, now 4-5 a week, often to combat headaches/ Neck pain. My next advise was regarding to sleep hygiene I would like her to make a routine plan when to go to bed and to switch all electronics including the TV off 30 minutes before she starts her 14.  If she has any worries that night any ideas she should commit them to paper and just delegate for the next day I like her to turn AROUND so that they are not visible and all blue or gray light emitting electric devices should not be in the bedroom or covered by a cloth.   She does prefer a cooler temperature in her bedroom which is usually helpful for sleep.  I also recommended that my patients take a hot bath or shower before bedtime as the increased core temperature helps to maintain and sustain sleep during the night.   REVIEW OF SYSTEMS: Out of a complete 14 system review of symptoms, the patient complains only of the following symptoms, hip pain and all other reviewed systems are negative.  ESS: 6/24, previously 5/24 FSS: not completed   ALLERGIES: Allergies  Allergen Reactions   Gabapentin Other (See Comments)    tachycardia   Sulfonamide  Derivatives     Pt doesn't remember   Penicillins Swelling    HOME MEDICATIONS: Outpatient Medications Prior to Visit  Medication Sig Dispense Refill   diphenhydrAMINE (BENADRYL) 25 MG tablet Take 25 mg by mouth at bedtime as needed.     melatonin 5 MG TABS Take 5 mg by mouth at bedtime as needed.     No facility-administered medications prior to visit.    PAST MEDICAL HISTORY: Past Medical History:  Diagnosis Date   Anxiety    Depression    Insomnia     PAST SURGICAL  HISTORY: Past Surgical History:  Procedure Laterality Date   CESAREAN SECTION  2000,2003   CHOLECYSTECTOMY  2001    FAMILY HISTORY: Family History  Problem Relation Age of Onset   Alzheimer's disease Mother    Stroke Mother    Heart disease Father    COPD Father    Diabetes Father    Atrial fibrillation Father    Stroke Father    Diabetes Paternal Grandmother    Colon cancer Neg Hx     SOCIAL HISTORY: Social History   Socioeconomic History   Marital status: Married    Spouse name: Not on file   Number of children: Not on file   Years of education: Not on file   Highest education level: Not on file  Occupational History   Not on file  Tobacco Use   Smoking status: Never   Smokeless tobacco: Never  Substance and Sexual Activity   Alcohol use: No    Alcohol/week: 0.0 standard drinks of alcohol   Drug use: No   Sexual activity: Not on file  Other Topics Concern   Not on file  Social History Narrative   Not on file   Social Determinants of Health   Financial Resource Strain: Not on file  Food Insecurity: Not on file  Transportation Needs: Not on file  Physical Activity: Not on file  Stress: Not on file  Social Connections: Not on file  Intimate Partner Violence: Not on file     PHYSICAL EXAM  There were no vitals filed for this visit.  There is no height or weight on file to calculate BMI.  Generalized: Well developed, in no acute distress  Cardiology: normal rate and  rhythm, no murmur noted Respiratory: clear to auscultation bilaterally  Neurological examination  Mentation: Alert oriented to time, place, history taking. Follows all commands speech and language fluent Cranial nerve II-XII: Pupils were equal round reactive to light. Extraocular movements were full, visual field were full  Motor: The motor testing reveals 5 over 5 strength of all 4 extremities. Good symmetric motor tone is noted throughout.  Gait and station: Gait is normal.    DIAGNOSTIC DATA (LABS, IMAGING, TESTING) - I reviewed patient records, labs, notes, testing and imaging myself where available.      No data to display           No results found for: "WBC", "HGB", "HCT", "MCV", "PLT" No results found for: "NA", "K", "CL", "CO2", "GLUCOSE", "BUN", "CREATININE", "CALCIUM", "PROT", "ALBUMIN", "AST", "ALT", "ALKPHOS", "BILITOT", "GFRNONAA", "GFRAA" No results found for: "CHOL", "HDL", "LDLCALC", "LDLDIRECT", "TRIG", "CHOLHDL" No results found for: "HGBA1C" No results found for: "VITAMINB12" No results found for: "TSH"   ASSESSMENT AND PLAN 56 y.o. year old female  has a past medical history of Anxiety, Depression, and Insomnia. here with   No diagnosis found.   CHINYERE GALIANO is doing well on CPAP therapy. She does feel that she is resting better and sleeping longer than she has. She does report more hip pain since lying more still at night and is addressing with PCP. Unfortuantely, we do not have any data to evaluated from her SD card. We will work with [facility] to locate her data and evaluate. We will adjust care accordingly based on compliance report. She was encouraged to continue using CPAP nightly and for greater than 4 hours each night. She will call [facility] to have mask fit assessed. Risks of untreated sleep apnea review and education materials provided. Healthy lifestyle  habits encouraged. She will follow up pending review of download. She verbalizes understanding  and agreement with this plan.    No orders of the defined types were placed in this encounter.     No orders of the defined types were placed in this encounter.      Shawnie Dapper, FNP-C 02/24/2022, 4:05 PM [facility] [address], [address] [place], [place] [address] [phone]

## 2022-02-25 ENCOUNTER — Encounter: Payer: Self-pay | Admitting: Family Medicine

## 2022-02-25 ENCOUNTER — Ambulatory Visit (INDEPENDENT_AMBULATORY_CARE_PROVIDER_SITE_OTHER): Payer: 59 | Admitting: Family Medicine

## 2022-02-25 VITALS — BP 134/81 | HR 73 | Ht 70.0 in | Wt 235.5 lb

## 2022-02-25 DIAGNOSIS — Z9989 Dependence on other enabling machines and devices: Secondary | ICD-10-CM | POA: Diagnosis not present

## 2022-02-25 DIAGNOSIS — G4733 Obstructive sleep apnea (adult) (pediatric): Secondary | ICD-10-CM | POA: Diagnosis not present

## 2022-02-25 NOTE — Progress Notes (Signed)
CM sent to [facility] for new order ?

## 2022-02-28 DIAGNOSIS — G4733 Obstructive sleep apnea (adult) (pediatric): Secondary | ICD-10-CM | POA: Diagnosis not present

## 2022-03-06 DIAGNOSIS — G4733 Obstructive sleep apnea (adult) (pediatric): Secondary | ICD-10-CM | POA: Diagnosis not present

## 2022-03-18 DIAGNOSIS — N939 Abnormal uterine and vaginal bleeding, unspecified: Secondary | ICD-10-CM | POA: Diagnosis not present

## 2022-03-18 DIAGNOSIS — N959 Unspecified menopausal and perimenopausal disorder: Secondary | ICD-10-CM | POA: Diagnosis not present

## 2022-03-19 ENCOUNTER — Other Ambulatory Visit: Payer: Self-pay | Admitting: Internal Medicine

## 2022-03-19 DIAGNOSIS — Z1231 Encounter for screening mammogram for malignant neoplasm of breast: Secondary | ICD-10-CM

## 2022-03-25 ENCOUNTER — Ambulatory Visit
Admission: RE | Admit: 2022-03-25 | Discharge: 2022-03-25 | Disposition: A | Discharge status: Home / Self Care | Payer: 59 | Source: Ambulatory Visit | Attending: Internal Medicine | Admitting: Internal Medicine

## 2022-03-25 DIAGNOSIS — Z1231 Encounter for screening mammogram for malignant neoplasm of breast: Secondary | ICD-10-CM

## 2022-03-31 DIAGNOSIS — L821 Other seborrheic keratosis: Secondary | ICD-10-CM | POA: Diagnosis not present

## 2022-03-31 DIAGNOSIS — D225 Melanocytic nevi of trunk: Secondary | ICD-10-CM | POA: Diagnosis not present

## 2022-03-31 DIAGNOSIS — D1801 Hemangioma of skin and subcutaneous tissue: Secondary | ICD-10-CM | POA: Diagnosis not present

## 2022-03-31 DIAGNOSIS — L820 Inflamed seborrheic keratosis: Secondary | ICD-10-CM | POA: Diagnosis not present

## 2022-03-31 DIAGNOSIS — G4733 Obstructive sleep apnea (adult) (pediatric): Secondary | ICD-10-CM | POA: Diagnosis not present

## 2022-03-31 DIAGNOSIS — L853 Xerosis cutis: Secondary | ICD-10-CM | POA: Diagnosis not present

## 2022-03-31 DIAGNOSIS — L565 Disseminated superficial actinic porokeratosis (DSAP): Secondary | ICD-10-CM | POA: Diagnosis not present

## 2022-04-07 DIAGNOSIS — N950 Postmenopausal bleeding: Secondary | ICD-10-CM | POA: Diagnosis not present

## 2022-04-29 ENCOUNTER — Telehealth: Payer: Self-pay | Admitting: Family Medicine

## 2022-04-29 NOTE — Telephone Encounter (Signed)
Pt was just seen for CPAP on 8/3, does not need to keep 10/10 appt unless having issues. LVM and sent a mychart msg advising pt.

## 2022-04-30 DIAGNOSIS — G4733 Obstructive sleep apnea (adult) (pediatric): Secondary | ICD-10-CM | POA: Diagnosis not present

## 2022-05-04 ENCOUNTER — Telehealth: Payer: 59 | Admitting: Family Medicine

## 2022-05-13 DIAGNOSIS — N950 Postmenopausal bleeding: Secondary | ICD-10-CM | POA: Diagnosis not present

## 2022-05-13 DIAGNOSIS — Z3202 Encounter for pregnancy test, result negative: Secondary | ICD-10-CM | POA: Diagnosis not present

## 2022-05-14 DIAGNOSIS — G4733 Obstructive sleep apnea (adult) (pediatric): Secondary | ICD-10-CM | POA: Diagnosis not present

## 2022-05-17 DIAGNOSIS — I8312 Varicose veins of left lower extremity with inflammation: Secondary | ICD-10-CM | POA: Diagnosis not present

## 2022-05-17 DIAGNOSIS — I8311 Varicose veins of right lower extremity with inflammation: Secondary | ICD-10-CM | POA: Diagnosis not present

## 2022-05-17 DIAGNOSIS — I872 Venous insufficiency (chronic) (peripheral): Secondary | ICD-10-CM | POA: Diagnosis not present

## 2022-05-27 DIAGNOSIS — Z09 Encounter for follow-up examination after completed treatment for conditions other than malignant neoplasm: Secondary | ICD-10-CM | POA: Diagnosis not present

## 2022-05-31 DIAGNOSIS — G4733 Obstructive sleep apnea (adult) (pediatric): Secondary | ICD-10-CM | POA: Diagnosis not present

## 2022-06-14 DIAGNOSIS — G4733 Obstructive sleep apnea (adult) (pediatric): Secondary | ICD-10-CM | POA: Diagnosis not present

## 2022-07-14 DIAGNOSIS — G4733 Obstructive sleep apnea (adult) (pediatric): Secondary | ICD-10-CM | POA: Diagnosis not present

## 2022-08-09 DIAGNOSIS — L821 Other seborrheic keratosis: Secondary | ICD-10-CM | POA: Diagnosis not present

## 2022-08-09 DIAGNOSIS — I8312 Varicose veins of left lower extremity with inflammation: Secondary | ICD-10-CM | POA: Diagnosis not present

## 2022-08-09 DIAGNOSIS — I8311 Varicose veins of right lower extremity with inflammation: Secondary | ICD-10-CM | POA: Diagnosis not present

## 2022-08-09 DIAGNOSIS — L565 Disseminated superficial actinic porokeratosis (DSAP): Secondary | ICD-10-CM | POA: Diagnosis not present

## 2022-08-09 DIAGNOSIS — I872 Venous insufficiency (chronic) (peripheral): Secondary | ICD-10-CM | POA: Diagnosis not present

## 2022-08-13 DIAGNOSIS — N952 Postmenopausal atrophic vaginitis: Secondary | ICD-10-CM | POA: Diagnosis not present

## 2022-08-13 DIAGNOSIS — Z6834 Body mass index (BMI) 34.0-34.9, adult: Secondary | ICD-10-CM | POA: Diagnosis not present

## 2022-08-13 DIAGNOSIS — Z01419 Encounter for gynecological examination (general) (routine) without abnormal findings: Secondary | ICD-10-CM | POA: Diagnosis not present

## 2022-09-07 DIAGNOSIS — E786 Lipoprotein deficiency: Secondary | ICD-10-CM | POA: Diagnosis not present

## 2022-09-07 DIAGNOSIS — E669 Obesity, unspecified: Secondary | ICD-10-CM | POA: Diagnosis not present

## 2022-10-12 ENCOUNTER — Telehealth: Payer: Self-pay | Admitting: Family Medicine

## 2022-10-12 NOTE — Telephone Encounter (Signed)
Called pt to get more info. Pt reports she is having a lot of stomach problems/gas/bloating. She is having soft stools/strong odor. Taking Nexium, helps temporarily but sx return. If she changes positions while sleeping, hears liquid moving around. Noticed sx sometime last yr. Feels sx are worsening over the last yr. She is compliant with CPAP. Not sleeping well. She has been on CPAP now for 2 yr in July 2024. Also has belching that started. Has back pain while sleeping. Takes ibuprofen prn for this.   She did lose her dad, mother in law and uncle last yr. Started fluoxetine 10mg  daily, buspar 5mg  po BID at that time. Unsure if meds related. She feels CPAP is causing gas build up.   She has Ibreeze machine, unable to pull data. She is willing to bring SD card for [place] to complete download for NP to review if needed.  Last seen 02/25/22 and yearly f/u scheduled for 03/03/23.

## 2022-10-12 NOTE — Telephone Encounter (Signed)
Pt informed with Amy message below, she will follow up with PCP, she did not wish to lower max pressure.   Please let her know that I have reviewed her report. The compliance is great. Her apneic events are ver well managed at 0.7 events per hour. She has an autotitrating machine with pressure settings from 6-16cmH20 but she is only needing about 11cmH20 on average. The maximum pressure she needed was 12.9. I think her report looks great! We could try to lower the max pressure to 14 to see if that make any difference but if her machine isn't needing to use that much pressure she should not notice any changes. I am happy to try if she would like. I would definitely have her follow up with PCP. I am concerned that something is going on from a GI standpoint.  Amy

## 2022-10-12 NOTE — Telephone Encounter (Signed)
Called pt back. Relayed AL,NP note. Pt verbalized understanding. She will bring by SD card this afternoon for [place] to pull updated report. She wants this looked at first to make sure no adjustments need to be made to CPAP. If ok, she will f/u with PCP. She has upcoming physical with them.

## 2022-10-12 NOTE — Telephone Encounter (Signed)
Pt called stated she would like to talk to nurse about some side effects she may be having from her CPAP machine. Please call pt before 10 am  or ofter 12 pm because she will be in a meeting at work.

## 2022-10-22 DIAGNOSIS — G4733 Obstructive sleep apnea (adult) (pediatric): Secondary | ICD-10-CM | POA: Diagnosis not present

## 2022-11-11 DIAGNOSIS — E786 Lipoprotein deficiency: Secondary | ICD-10-CM | POA: Diagnosis not present

## 2022-11-11 DIAGNOSIS — R7989 Other specified abnormal findings of blood chemistry: Secondary | ICD-10-CM | POA: Diagnosis not present

## 2022-11-11 DIAGNOSIS — E538 Deficiency of other specified B group vitamins: Secondary | ICD-10-CM | POA: Diagnosis not present

## 2022-11-11 DIAGNOSIS — K219 Gastro-esophageal reflux disease without esophagitis: Secondary | ICD-10-CM | POA: Diagnosis not present

## 2022-11-11 DIAGNOSIS — Z1212 Encounter for screening for malignant neoplasm of rectum: Secondary | ICD-10-CM | POA: Diagnosis not present

## 2022-11-11 DIAGNOSIS — F419 Anxiety disorder, unspecified: Secondary | ICD-10-CM | POA: Diagnosis not present

## 2022-11-11 LAB — HEPATIC FUNCTION PANEL
ALT: 24 U/L (ref 7–35)
AST: 20 (ref 13–35)
Alkaline Phosphatase: 104 (ref 25–125)
Bilirubin, Total: 0.6

## 2022-11-11 LAB — LIPID PANEL
Cholesterol: 202 — AB (ref 0–200)
HDL: 39 (ref 35–70)
LDL Cholesterol: 130
LDl/HDL Ratio: 3.3
Triglycerides: 165 — AB (ref 40–160)

## 2022-11-11 LAB — CBC AND DIFFERENTIAL
HCT: 43 (ref 36–46)
Hemoglobin: 14.5 (ref 12.0–16.0)
Platelets: 369 10*3/uL (ref 150–400)
WBC: 7.2

## 2022-11-11 LAB — BASIC METABOLIC PANEL
BUN: 12 (ref 4–21)
CO2: 25 — AB (ref 13–22)
Chloride: 107 (ref 99–108)
Creatinine: 0.7 (ref 0.5–1.1)
Glucose: 97
Potassium: 4.7 mEq/L (ref 3.5–5.1)
Sodium: 143 (ref 137–147)

## 2022-11-11 LAB — COMPREHENSIVE METABOLIC PANEL
Albumin: 3.6 (ref 3.5–5.0)
Calcium: 8.6 — AB (ref 8.7–10.7)

## 2022-11-11 LAB — VITAMIN B12: Vitamin B-12: 462

## 2022-11-11 LAB — CBC: RBC: 4.9 (ref 3.87–5.11)

## 2022-11-18 DIAGNOSIS — Z0000 Encounter for general adult medical examination without abnormal findings: Secondary | ICD-10-CM | POA: Diagnosis not present

## 2022-11-18 DIAGNOSIS — Z1331 Encounter for screening for depression: Secondary | ICD-10-CM | POA: Diagnosis not present

## 2022-11-18 DIAGNOSIS — G4733 Obstructive sleep apnea (adult) (pediatric): Secondary | ICD-10-CM | POA: Diagnosis not present

## 2022-11-18 DIAGNOSIS — L309 Dermatitis, unspecified: Secondary | ICD-10-CM | POA: Diagnosis not present

## 2022-11-18 DIAGNOSIS — Z1212 Encounter for screening for malignant neoplasm of rectum: Secondary | ICD-10-CM | POA: Diagnosis not present

## 2022-11-18 DIAGNOSIS — E786 Lipoprotein deficiency: Secondary | ICD-10-CM | POA: Diagnosis not present

## 2022-11-18 DIAGNOSIS — R82998 Other abnormal findings in urine: Secondary | ICD-10-CM | POA: Diagnosis not present

## 2022-11-18 DIAGNOSIS — J309 Allergic rhinitis, unspecified: Secondary | ICD-10-CM | POA: Diagnosis not present

## 2022-11-18 DIAGNOSIS — E669 Obesity, unspecified: Secondary | ICD-10-CM | POA: Diagnosis not present

## 2022-11-18 DIAGNOSIS — M25562 Pain in left knee: Secondary | ICD-10-CM | POA: Diagnosis not present

## 2022-11-18 DIAGNOSIS — E538 Deficiency of other specified B group vitamins: Secondary | ICD-10-CM | POA: Diagnosis not present

## 2022-11-18 DIAGNOSIS — Z9189 Other specified personal risk factors, not elsewhere classified: Secondary | ICD-10-CM | POA: Diagnosis not present

## 2022-11-22 DIAGNOSIS — G4733 Obstructive sleep apnea (adult) (pediatric): Secondary | ICD-10-CM | POA: Diagnosis not present

## 2022-11-23 DIAGNOSIS — Z0289 Encounter for other administrative examinations: Secondary | ICD-10-CM

## 2022-11-24 ENCOUNTER — Encounter (INDEPENDENT_AMBULATORY_CARE_PROVIDER_SITE_OTHER): Payer: 59 | Admitting: Family Medicine

## 2022-12-13 DIAGNOSIS — G4733 Obstructive sleep apnea (adult) (pediatric): Secondary | ICD-10-CM | POA: Diagnosis not present

## 2022-12-22 ENCOUNTER — Encounter (INDEPENDENT_AMBULATORY_CARE_PROVIDER_SITE_OTHER): Payer: Self-pay | Admitting: Family Medicine

## 2022-12-22 ENCOUNTER — Ambulatory Visit (INDEPENDENT_AMBULATORY_CARE_PROVIDER_SITE_OTHER): Payer: 59 | Admitting: Family Medicine

## 2022-12-22 VITALS — BP 128/75 | HR 66 | Temp 97.5°F | Ht 70.0 in | Wt 239.0 lb

## 2022-12-22 DIAGNOSIS — G4733 Obstructive sleep apnea (adult) (pediatric): Secondary | ICD-10-CM

## 2022-12-22 DIAGNOSIS — F32A Depression, unspecified: Secondary | ICD-10-CM | POA: Diagnosis not present

## 2022-12-22 DIAGNOSIS — E669 Obesity, unspecified: Secondary | ICD-10-CM

## 2022-12-22 DIAGNOSIS — R0602 Shortness of breath: Secondary | ICD-10-CM | POA: Diagnosis not present

## 2022-12-22 DIAGNOSIS — Z1331 Encounter for screening for depression: Secondary | ICD-10-CM

## 2022-12-22 DIAGNOSIS — E782 Mixed hyperlipidemia: Secondary | ICD-10-CM

## 2022-12-22 DIAGNOSIS — F419 Anxiety disorder, unspecified: Secondary | ICD-10-CM | POA: Diagnosis not present

## 2022-12-22 DIAGNOSIS — R5383 Other fatigue: Secondary | ICD-10-CM | POA: Diagnosis not present

## 2022-12-22 DIAGNOSIS — Z6834 Body mass index (BMI) 34.0-34.9, adult: Secondary | ICD-10-CM | POA: Diagnosis not present

## 2022-12-22 NOTE — Progress Notes (Signed)
Chief Complaint:   Megan Bryant (MR# [id number]) is a 57 y.o. female who presents for evaluation and treatment of Megan and related comorbidities. Current BMI is Body mass index is 34.29 kg/m. Megan Bryant has been struggling with her weight for many years and has been unsuccessful in either losing weight, maintaining weight loss, or reaching her healthy weight goal.  Referred by Dr. Timothy Lasso and a friend who comes here.  Came to information session.  Lactose sensitive- can eat yogurt and cheese and drinks lactose free milk.  History of preeclampisa with 1st pregnancy at term; needed c section.  Second pregnancy was 4 weeks early but had elevated BP.  Currently undergoing menopause- started in October 2023.  Significant family history of heart disease.  Real Megan Bryant.  Works Arts administrator (varies) mostly M-F.  Lives with husband Fredrik Cove who is supportive of her and occasionally eats with him and may or may not be changing how he eats (he is allergic to poultry).  Desired weight is stable lbs and last time she was that weight was 25years ago.  Started gaining weight around 2012-2013.  Previously did [employer]oylene [employer] and [employer].  Only did Doylene Bode once but has done weight watchers 3-4 times.  She is eating out for dinner frequently- sandwiches or [place] food.  Does get a biscuit for breakfast occasionally.  Food Recall: Water in the am, biscuit in the am ([place])- bacon, egg and cheese.  Half and half tea (eats and drinks all and feels full and satisfied).  Lunch is leftovers if there is any, or ham and cheese (3 slices ham, 1 slice cheese) or PB&J sandwich with chips on a plate.  Will snack between lunch and dinner- nuts (1/2 cup total).  May go get a cookie if working at home or cheese and crackers. Dinner is Building surveyor- Customer service manager with double salad with pita bread or rice divided in 2 meals or salmon 3.5oz with wild rice or cheese grits or broccoli (1 cup rice, 1  cup vegetables).  After dinner will have ice cream 2 cups or cookies or slice of pie.  Megan Bryant is currently in the action stage of change and ready to dedicate time achieving and maintaining a healthier weight. Megan Bryant is interested in becoming our patient and working on intensive lifestyle modifications including (but not limited to) diet and exercise for weight loss.  Megan Bryant's habits were reviewed today and are as follows: Her family eats meals together, she thinks her family will eat healthier with her, her desired weight loss is 59 lbs, she has been heavy most of her life, she started gaining weight starting past her 40's, her heaviest weight ever was 245 pounds, she is a picky eater and doesn't like to eat healthier foods, she has significant food cravings issues, she snacks frequently in the evenings, she wakes up frequently in the middle of the night to eat, she is frequently drinking liquids with calories, she frequently makes poor food choices, she has problems with excessive hunger, she frequently eats larger portions than normal, and she struggles with emotional eating.  Depression Screen Sheri's Food and Mood (modified PHQ-9) score was 22.  Subjective:   1. Other fatigue Megan Bryant admits to daytime somnolence and admits to waking up still tired. Patient has a history of symptoms of daytime fatigue, morning fatigue, and morning headache. Megan Bryant generally gets 6 or 7 hours of sleep per night, and states that she has nightime awakenings. Snoring  is present. Apneic episodes are present. Epworth Sleepiness Score is 6. EKG, NSR at 68 bpm.  2. SOBOE (shortness of breath on exertion) Megan Bryant notes increasing shortness of breath with exercising and seems to be worsening over time with weight gain. She notes getting out of breath sooner with activity than she used to. This has not gotten worse recently. Megan Bryant denies shortness of breath at rest or orthopnea.  3. Mixed  hyperlipidemia Elevated LDL and triglycerides.  Patient is not on any medications.  4. OSA (obstructive sleep apnea) Patient has a CPAP and wears nightly.  She was diagnosed about 2 years ago.  5. Anxiety and depression Patient is on fluoxetine and BuSpar 10 mg and 5 mg.  She has been on meds since last August.  Assessment/Plan:   1. Other fatigue Doneen does feel that her weight is causing her energy to be lower than it should be. Fatigue may be related to Megan, depression or many other causes. Labs will be ordered, and in the meanwhile, Ladeidra will focus on self care including making healthy food choices, increasing physical activity and focusing on stress reduction. Check IC and labs today.   - EKG 12-Lead - Hemoglobin A1c - Insulin, random - VITAMIN D 25 Hydroxy (Vit-D Deficiency, Fractures) - Folate - T3 - T4, free  2. SOBOE (shortness of breath on exertion) Kairah does feel that she gets out of breath more easily that she used to when she exercises. Megan Bryant's shortness of breath appears to be Megan related and exercise induced. She has agreed to work on weight loss and gradually increase exercise to treat her exercise induced shortness of breath. Will continue to monitor closely.  3. Mixed hyperlipidemia Follow-up for repeat FLP in 3 months.  4. OSA (obstructive sleep apnea) Follow up on compliance at subsequent appointments.   5. Anxiety and depression Follow-up on symptoms at next appointment.  6. Depression screening Megan Bryant had a positive depression screening. Depression is commonly associated with Megan and often results in emotional eating behaviors. We will monitor this closely and work on CBT to help improve the non-hunger eating patterns. Referral to Psychology may be required if no improvement is seen as she continues in our clinic.  7. Class 1 Megan with serious comorbidity and body mass index (BMI) of 34.0 to 34.9 in adult, unspecified Megan  type Megan Bryant is currently in the action stage of change and her goal is to continue with weight loss efforts. I recommend Megan Bryant begin the structured treatment plan as follows:  She has agreed to the Category 3 Plan.  Exercise goals: No exercise has been prescribed at this time.   Behavioral modification strategies: increasing lean protein intake, meal planning and cooking strategies, keeping healthy foods in the home, and planning for success.  She was informed of the importance of frequent follow-up visits to maximize her success with intensive lifestyle modifications for her multiple health conditions. She was informed we would discuss her lab results at her next visit unless there is a critical issue that needs to be addressed sooner. Neiva agreed to keep her next visit at the agreed upon time to discuss these results.  Objective:   Blood pressure 128/75, pulse 66, temperature (!) 97.5 F (36.4 C), height 5\' 10"  (1.778 m), weight 239 lb (108.4 kg), SpO2 98 %. Body mass index is 34.29 kg/m.  EKG: Normal sinus rhythm, rate 68 bpm.  Indirect Calorimeter completed today shows a VO2 of 261 and a REE of 1800.  Her  calculated basal metabolic rate is 4098 thus her basal metabolic rate is worse than expected.  General: Cooperative, alert, well developed, in no acute distress. HEENT: Conjunctivae and lids unremarkable. Cardiovascular: Regular rhythm.  Lungs: Normal work of breathing. Neurologic: No focal deficits.   Lab Results  Component Value Date   CREATININE 0.7 11/11/2022   BUN 12 11/11/2022   NA 143 11/11/2022   K 4.7 11/11/2022   CL 107 11/11/2022   CO2 25 (A) 11/11/2022   Lab Results  Component Value Date   ALT 24 11/11/2022   AST 20 11/11/2022   ALKPHOS 104 11/11/2022   Lab Results  Component Value Date   HGBA1C 6.2 (H) 12/22/2022   Lab Results  Component Value Date   INSULIN 22.9 12/22/2022   No results found for: "TSH" Lab Results  Component Value Date    CHOL 202 (A) 11/11/2022   HDL 39 11/11/2022   LDLCALC 130 11/11/2022   TRIG 165 (A) 11/11/2022   Lab Results  Component Value Date   WBC 7.2 11/11/2022   HGB 14.5 11/11/2022   HCT 43 11/11/2022   PLT 369 11/11/2022   No results found for: "IRON", "TIBC", "FERRITIN"  Attestation Statements:   Reviewed by clinician on day of visit: allergies, medications, problem list, medical history, surgical history, family history, social history, and previous encounter notes.  Time spent on visit including pre-visit chart review and post-visit charting and care was 45 minutes.   I, Malcolm Metro, RMA, am acting as transcriptionist for Reuben Likes, MD.  I have reviewed the above documentation for accuracy and completeness, and I agree with the above. - Reuben Likes, MD

## 2022-12-23 LAB — VITAMIN D 25 HYDROXY (VIT D DEFICIENCY, FRACTURES): Vit D, 25-Hydroxy: 18.2 ng/mL — ABNORMAL LOW (ref 30.0–100.0)

## 2022-12-23 LAB — HEMOGLOBIN A1C
Est. average glucose Bld gHb Est-mCnc: 131 mg/dL
Hgb A1c MFr Bld: 6.2 % — ABNORMAL HIGH (ref 4.8–5.6)

## 2022-12-23 LAB — FOLATE: Folate: 17 ng/mL (ref >3.0)

## 2022-12-23 LAB — INSULIN, RANDOM: INSULIN: 22.9 u[IU]/mL (ref 2.6–24.9)

## 2022-12-23 LAB — T4, FREE
EGFR (Non-African Amer.): 86.6
Free T4: 1.06 ng/dL (ref 0.82–1.77)

## 2022-12-23 LAB — T3: T3, Total: 136 ng/dL (ref 71–180)

## 2023-01-05 ENCOUNTER — Ambulatory Visit (INDEPENDENT_AMBULATORY_CARE_PROVIDER_SITE_OTHER): Payer: 59 | Admitting: Family Medicine

## 2023-01-05 ENCOUNTER — Encounter (INDEPENDENT_AMBULATORY_CARE_PROVIDER_SITE_OTHER): Payer: Self-pay | Admitting: Family Medicine

## 2023-01-05 VITALS — BP 120/69 | HR 68 | Temp 98.3°F | Ht 70.0 in | Wt 232.0 lb

## 2023-01-05 DIAGNOSIS — R7303 Prediabetes: Secondary | ICD-10-CM

## 2023-01-05 DIAGNOSIS — E669 Obesity, unspecified: Secondary | ICD-10-CM | POA: Diagnosis not present

## 2023-01-05 DIAGNOSIS — E559 Vitamin D deficiency, unspecified: Secondary | ICD-10-CM | POA: Diagnosis not present

## 2023-01-05 DIAGNOSIS — E7800 Pure hypercholesterolemia, unspecified: Secondary | ICD-10-CM | POA: Diagnosis not present

## 2023-01-05 DIAGNOSIS — Z6833 Body mass index (BMI) 33.0-33.9, adult: Secondary | ICD-10-CM

## 2023-01-05 MED ORDER — VITAMIN D (ERGOCALCIFEROL) 1.25 MG (50000 UNIT) PO CAPS: 50000.0000 [IU] | ORAL_CAPSULE | ORAL | 0 refills | Status: DC

## 2023-01-05 NOTE — Progress Notes (Signed)
Chief Complaint:   OBESITY Megan Bryant is here to discuss her progress with her obesity treatment plan along with follow-up of her obesity related diagnoses. Megan Bryant is on the Category 3 Plan and states she is following her eating plan approximately 95-98% of the time. Megan Bryant states she is walking and using hand weights for 20-25 minutes 3 times per week.  Today's visit was #: 2 Starting weight: 239 lbs Starting date: 12/22/2022 Today's weight: 232 lbs Today's date: 01/05/2023 Total lbs lost to date: 7 Total lbs lost since last in-office visit: 7  Interim History: First few weeks weren't as bad as she thought they would be.  Felt she did well following plan.  Wants something after dinner. Did a variety of meats for her meals and did eggs mostly at breakfast because she doesn't like yogurt enough to eat it twice.  She increased her diet coke intake. Hasn't been drinking as much water.  Sometimes she gets hungry right before dinner time. For snack calories she is doing grapes, cheese sticks with deli meat.  Doing mostly 8oz at supper.  She went out to [employer] and ordered the salmon on the list.  She ate a piece of birthday cake at her birthday lunch. Next few weeks she is going out to [employer] and she is going out to dinner for Father's Day. Wondering about other breakfast options- she likes oatmeal and would be open to adding in meat for breakfast.   Subjective:   1. Vitamin D deficiency Patient is not on vitamin D supplementation, and she notes fatigue.  Her recent vitamin D level was of 18.2.  2. Hypercholesteremia Patient's recent LDL was 130, triglycerides 165, and HDL 39.  She is not on medications.  3. Prediabetes Patient's recent A1c was 6.2 and insulin 22.9.  She is not on medications.  Assessment/Plan:   1. Vitamin D deficiency Patient agreed to start prescription vitamin D 50,000 IU once weekly with no refills.  - Vitamin D, Ergocalciferol, (DRISDOL) 1.25  MG (50000 UNIT) CAPS capsule; Take 1 capsule (50,000 Units total) by mouth every 7 (seven) days.  Dispense: 4 capsule; Refill: 0  2. Hypercholesteremia We will repeat FLP in 3 months; no medications at this time.  3. Prediabetes Pathophysiology of insulin resistance, prediabetes, and diabetes mellitus were discussed with the patient today.  No significant cravings or hunger, still we will hold on medications at this time.  4. BMI 33.0-33.9,adult  5. Obesity with starting BMI of 34.4 Megan Bryant is currently in the action stage of change. As such, her goal is to continue with weight loss efforts. She has agreed to the Category 3 Plan.   Exercise goals: No exercise has been prescribed at this time.  Behavioral modification strategies: increasing lean protein intake, meal planning and cooking strategies, keeping healthy foods in the home, and planning for success.  Megan Bryant has agreed to follow-up with our clinic in 2 to 3 weeks. She was informed of the importance of frequent follow-up visits to maximize her success with intensive lifestyle modifications for her multiple health conditions.   Objective:   Blood pressure 120/69, pulse 68, temperature 98.3 F (36.8 C), height 5\' 10"  (1.778 m), weight 232 lb (105.2 kg), SpO2 99 %. Body mass index is 33.29 kg/m.  General: Cooperative, alert, well developed, in no acute distress. HEENT: Conjunctivae and lids unremarkable. Cardiovascular: Regular rhythm.  Lungs: Normal work of breathing. Neurologic: No focal deficits.   Lab Results  Component Value Date  CREATININE 0.7 11/11/2022   BUN 12 11/11/2022   NA 143 11/11/2022   K 4.7 11/11/2022   CL 107 11/11/2022   CO2 25 (A) 11/11/2022   Lab Results  Component Value Date   ALT 24 11/11/2022   AST 20 11/11/2022   ALKPHOS 104 11/11/2022   Lab Results  Component Value Date   HGBA1C 6.2 (H) 12/22/2022   Lab Results  Component Value Date   INSULIN 22.9 12/22/2022   No results found  for: "TSH" Lab Results  Component Value Date   CHOL 202 (A) 11/11/2022   HDL 39 11/11/2022   LDLCALC 130 11/11/2022   TRIG 165 (A) 11/11/2022   Lab Results  Component Value Date   VD25OH 18.2 (L) 12/22/2022   Lab Results  Component Value Date   WBC 7.2 11/11/2022   HGB 14.5 11/11/2022   HCT 43 11/11/2022   PLT 369 11/11/2022   No results found for: "IRON", "TIBC", "FERRITIN"  Attestation Statements:   Reviewed by clinician on day of visit: allergies, medications, problem list, medical history, surgical history, family history, social history, and previous encounter notes.  Time spent on visit including pre-visit chart review and post-visit care and charting was 45 minutes.   I, Burt Knack, am acting as transcriptionist for Reuben Likes, MD.  I have reviewed the above documentation for accuracy and completeness, and I agree with the above. - Reuben Likes, MD

## 2023-01-24 ENCOUNTER — Ambulatory Visit (INDEPENDENT_AMBULATORY_CARE_PROVIDER_SITE_OTHER): Payer: 59 | Admitting: Internal Medicine

## 2023-01-24 ENCOUNTER — Encounter (INDEPENDENT_AMBULATORY_CARE_PROVIDER_SITE_OTHER): Payer: Self-pay | Admitting: Internal Medicine

## 2023-01-24 VITALS — BP 133/83 | HR 87 | Temp 97.6°F | Ht 70.0 in | Wt 228.0 lb

## 2023-01-24 DIAGNOSIS — R7303 Prediabetes: Secondary | ICD-10-CM | POA: Diagnosis not present

## 2023-01-24 DIAGNOSIS — E669 Obesity, unspecified: Secondary | ICD-10-CM | POA: Diagnosis not present

## 2023-01-24 DIAGNOSIS — E559 Vitamin D deficiency, unspecified: Secondary | ICD-10-CM | POA: Diagnosis not present

## 2023-01-24 DIAGNOSIS — Z6832 Body mass index (BMI) 32.0-32.9, adult: Secondary | ICD-10-CM

## 2023-01-24 MED ORDER — VITAMIN D (ERGOCALCIFEROL) 1.25 MG (50000 UNIT) PO CAPS: 50000.0000 [IU] | ORAL_CAPSULE | ORAL | 0 refills | Status: DC

## 2023-01-24 NOTE — Progress Notes (Signed)
Office: [phone]  /  Fax: [phone]  WEIGHT SUMMARY AND BIOMETRICS  Vitals Temp: 97.6 F (36.4 C) BP: 133/83 Pulse Rate: 87 SpO2: 95 %   Anthropometric Measurements Height: 5\' 10"  (1.778 m) Weight: 228 lb (103.4 kg) BMI (Calculated): 32.71 Weight at Last Visit: 232lb Weight Lost Since Last Visit: 4lb Weight Gained Since Last Visit: 0 Starting Weight: 239lb Total Weight Loss (lbs): 11 lb (4.99 kg) Waist Measurement : 49 inches   Body Composition  Body Fat %: 38 % Fat Mass (lbs): 86.8 lbs Muscle Mass (lbs): 134.6 lbs Total Body Water (lbs): 87.2 lbs Visceral Fat Rating : 10    No data recorded Today's Visit #: 3  Starting Date: 12/22/22   HPI  Chief Complaint: OBESITY  Madhuri is here to discuss her progress with her obesity treatment plan. She is on the the Category 3 Plan and states she is following her eating plan approximately 90 % of the time. She states she is exercising playing pickle ball 45 minutes 1-2 times per week.  Interval History:  Since last office visit she has lost 4 pounds.  Her BIA shows a reduction in body fat percentage from 44% to 38% her muscle mass has also increased. She reports good adherence to reduced calorie nutritional plan. She has been working on not skipping meals, increasing protein intake at every meal, eating more fruits, eating more vegetables, drinking more water, avoiding and or reducing liquid calories, making healthier choices, reducing portion sizes, and mindfulness around eating  Orixegenic Control: Denies problems with appetite and hunger signals.  Denies problems with satiety and satiation.  Denies problems with eating patterns and portion control.  Denies abnormal cravings.  Actually reports a decrease in cravings for sweets.  She does note occasional craving salty snacks Denies feeling deprived or restricted.   Barriers identified: none.   Pharmacotherapy for weight loss: She is currently taking no  anti-obesity medication.    ASSESSMENT AND PLAN  TREATMENT PLAN FOR OBESITY:  Recommended Dietary Goals  Mckenzie is currently in the action stage of change. As such, her goal is to continue weight management plan. She has agreed to: continue current plan  Behavioral Intervention  We discussed the following Behavioral Modification Strategies today: increasing lean protein intake, decreasing simple carbohydrates , increasing vegetables, increasing lower glycemic fruits, increasing fiber rich foods, avoiding skipping meals, increasing water intake, reading food labels , and planning for success.  Additional resources provided today:  Given handout on carbohydrates and hyperinsulinemia  Recommended Physical Activity Goals  Lilias has been advised to work up to 150 minutes of moderate intensity aerobic activity a week and strengthening exercises 2-3 times per week for cardiovascular health, weight loss maintenance and preservation of muscle mass.   She has agreed to :  Start strengthening exercises with a goal of 2-3 sessions a week   Pharmacotherapy We discussed various medication options to help Guila with her weight loss efforts and we both agreed to : continue with nutritional and behavioral strategies  ASSOCIATED CONDITIONS ADDRESSED TODAY  Prediabetes Assessment & Plan: Most recent A1c is  Lab Results  Component Value Date   HGBA1C 6.2 (H) 12/22/2022    Patient aware of disease state and risk of progression. This may contribute to abnormal cravings, fatigue and diabetic complications without having diabetes.  She also has hyperinsulinemia with a fasting insulin level in the 20s.  I counseled her on the risk associated with elevated insulin levels and strategies to improve insulin resistance.  She was provided with handout.  Losing 7 to 10% of body weight, increasing physical activity to a goal of 150 minutes a week at moderate intensity may improve condition. She may  also be a candidate for pharmacoprophylaxis with metformin or incretin mimetic.     Vitamin D deficiency Assessment & Plan: Most recent vitamin D levels  Lab Results  Component Value Date   VD25OH 18.2 (L) 12/22/2022     Deficiency state associated with adiposity and may result in leptin resistance, weight gain and fatigue. Currently on vitamin D supplementation without any adverse effects.  Plan: Continue high-dose vitamin D supplementation with a goal of 50 to 60 mg/dL.  Repeat levels in 4 months.     Orders: -     Vitamin D (Ergocalciferol); Take 1 capsule (50,000 Units total) by mouth every 7 (seven) days.  Dispense: 4 capsule; Refill: 0  Obesity with starting BMI of 34.4    PHYSICAL EXAM:  Blood pressure 133/83, pulse 87, temperature 97.6 F (36.4 C), height 5\' 10"  (1.778 m), weight 228 lb (103.4 kg), SpO2 95 %. Body mass index is 32.71 kg/m.  General: She is overweight, cooperative, alert, well developed, and in no acute distress. PSYCH: Has normal mood, affect and thought process.   HEENT: EOMI, sclerae are anicteric. Lungs: Normal breathing effort, no conversational dyspnea. Extremities: No edema.  Neurologic: No gross sensory or motor deficits. No tremors or fasciculations noted.    DIAGNOSTIC DATA REVIEWED:  BMET    Component Value Date/Time   NA 143 11/11/2022 0000   K 4.7 11/11/2022 0000   CL 107 11/11/2022 0000   CO2 25 (A) 11/11/2022 0000   BUN 12 11/11/2022 0000   CREATININE 0.7 11/11/2022 0000   CALCIUM 8.6 (A) 11/11/2022 0000   GFRNONAA 86.6 12/22/2022 1046   Lab Results  Component Value Date   HGBA1C 6.2 (H) 12/22/2022   Lab Results  Component Value Date   INSULIN 22.9 12/22/2022   No results found for: "TSH" CBC    Component Value Date/Time   WBC 7.2 11/11/2022 0000   RBC 4.9 11/11/2022 0000   HGB 14.5 11/11/2022 0000   HCT 43 11/11/2022 0000   PLT 369 11/11/2022 0000   Iron Studies No results found for: "IRON", "TIBC",  "FERRITIN", "IRONPCTSAT" Lipid Panel     Component Value Date/Time   CHOL 202 (A) 11/11/2022 0000   TRIG 165 (A) 11/11/2022 0000   HDL 39 11/11/2022 0000   LDLCALC 130 11/11/2022 0000   Hepatic Function Panel     Component Value Date/Time   ALBUMIN 3.6 11/11/2022 0000   AST 20 11/11/2022 0000   ALT 24 11/11/2022 0000   ALKPHOS 104 11/11/2022 0000   No results found for: "TSH" Nutritional Lab Results  Component Value Date   VD25OH 18.2 (L) 12/22/2022     Return in about 2 weeks (around 02/07/2023) for Dr. Lawson Radar.. She was informed of the importance of frequent follow up visits to maximize her success with intensive lifestyle modifications for her multiple health conditions.   ATTESTASTION STATEMENTS:  Reviewed by clinician on day of visit: allergies, medications, problem list, medical history, surgical history, family history, social history, and previous encounter notes.     Worthy Rancher, MD

## 2023-01-24 NOTE — Assessment & Plan Note (Signed)
Most recent A1c is  Lab Results  Component Value Date   HGBA1C 6.2 (H) 12/22/2022    Patient aware of disease state and risk of progression. This may contribute to abnormal cravings, fatigue and diabetic complications without having diabetes.  She also has hyperinsulinemia with a fasting insulin level in the 20s.  I counseled her on the risk associated with elevated insulin levels and strategies to improve insulin resistance.  She was provided with handout.  Losing 7 to 10% of body weight, increasing physical activity to a goal of 150 minutes a week at moderate intensity may improve condition. She may also be a candidate for pharmacoprophylaxis with metformin or incretin mimetic.

## 2023-01-24 NOTE — Assessment & Plan Note (Signed)
Most recent vitamin D levels  Lab Results  Component Value Date   VD25OH 18.2 (L) 12/22/2022     Deficiency state associated with adiposity and may result in leptin resistance, weight gain and fatigue. Currently on vitamin D supplementation without any adverse effects.  Plan: Continue high-dose vitamin D supplementation with a goal of 50 to 60 mg/dL.  Repeat levels in 4 months.

## 2023-02-09 ENCOUNTER — Encounter (INDEPENDENT_AMBULATORY_CARE_PROVIDER_SITE_OTHER): Payer: Self-pay | Admitting: Family Medicine

## 2023-02-09 ENCOUNTER — Ambulatory Visit (INDEPENDENT_AMBULATORY_CARE_PROVIDER_SITE_OTHER): Payer: 59 | Admitting: Family Medicine

## 2023-02-09 VITALS — BP 120/70 | HR 71 | Temp 97.7°F | Ht 70.0 in | Wt 224.0 lb

## 2023-02-09 DIAGNOSIS — Z6832 Body mass index (BMI) 32.0-32.9, adult: Secondary | ICD-10-CM | POA: Diagnosis not present

## 2023-02-09 DIAGNOSIS — R7303 Prediabetes: Secondary | ICD-10-CM

## 2023-02-09 DIAGNOSIS — E559 Vitamin D deficiency, unspecified: Secondary | ICD-10-CM | POA: Diagnosis not present

## 2023-02-09 DIAGNOSIS — E669 Obesity, unspecified: Secondary | ICD-10-CM

## 2023-02-09 MED ORDER — VITAMIN D (ERGOCALCIFEROL) 1.25 MG (50000 UNIT) PO CAPS: 50000.0000 [IU] | ORAL_CAPSULE | ORAL | 0 refills | Status: DC

## 2023-02-09 NOTE — Progress Notes (Signed)
Chief Complaint:   OBESITY Megan Bryant is here to discuss her progress with her obesity treatment plan along with follow-up of her obesity related diagnoses. Megan Bryant is on the Category 3 Plan and states she is following her eating plan approximately 90% of the time. Megan Bryant states she is playing pickle ball for 60 minutes 1-3 times per week.  Today's visit was #: 4 Starting weight: 239 lbs Starting date: 12/22/2022 Today's weight: 244 lbs Today's date: 02/09/2023 Total lbs lost to date: 0 Total lbs lost since last in-office visit: 4  Interim History: Patient's 4th visit today.  She has been low key and not doing much due to the heat.  She mentions that she has tried to play pickleball when weather isn't so oppressive.  She has been sticking to Category 3 pretty consistently.  She voices she eats eggs almost every am.  She as tried a few of the cereals but didn't like how she felt.  She is eating tuna out of the pouches and eating the double pouch.  She has incorporated more melon as well. Dinner she is still working on enjoying.  She mentions her chicken and vegetables are a bit boring. Next few weeks she doesn't have much coming up- no vacation, events or activities.  Subjective:   1. Vitamin D deficiency Patient is on prescription vitamin D.  She denies nausea, vomiting, or muscle weakness but notes fatigue.  2. Prediabetes Patient's last A1c was 6.2 on her initial labs.  She is not on medications.  Assessment/Plan:   1. Vitamin D deficiency Patient will continue prescription vitamin D once weekly, and we will refill for 1 month.  - Vitamin D, Ergocalciferol, (DRISDOL) 1.25 MG (50000 UNIT) CAPS capsule; Take 1 capsule (50,000 Units total) by mouth every 7 (seven) days.  Dispense: 4 capsule; Refill: 0  2. Prediabetes Patient will continue her category 3 meal plan with no change in treatment.  3. BMI 32.0-32.9,adult  4. Obesity with starting BMI of 34.4 Megan Bryant is currently  in the action stage of change. As such, her goal is to continue with weight loss efforts. She has agreed to the Category 3 Plan.   Exercise goals: All adults should avoid inactivity. Some physical activity is better than none, and adults who participate in any amount of physical activity gain some health benefits.  Behavioral modification strategies: increasing lean protein intake, meal planning and cooking strategies, keeping healthy foods in the home, and planning for success.  Megan Bryant has agreed to follow-up with our clinic in 2 to 3 weeks. She was informed of the importance of frequent follow-up visits to maximize her success with intensive lifestyle modifications for her multiple health conditions.   Objective:   Blood pressure 120/70, pulse 71, temperature 97.7 F (36.5 C), height 5\' 10"  (1.778 m), weight 224 lb (101.6 kg), SpO2 98%. Body mass index is 32.14 kg/m.  General: Cooperative, alert, well developed, in no acute distress. HEENT: Conjunctivae and lids unremarkable. Cardiovascular: Regular rhythm.  Lungs: Normal work of breathing. Neurologic: No focal deficits.   Lab Results  Component Value Date   CREATININE 0.7 11/11/2022   BUN 12 11/11/2022   NA 143 11/11/2022   K 4.7 11/11/2022   CL 107 11/11/2022   CO2 25 (A) 11/11/2022   Lab Results  Component Value Date   ALT 24 11/11/2022   AST 20 11/11/2022   ALKPHOS 104 11/11/2022   Lab Results  Component Value Date   HGBA1C 6.2 (H) 12/22/2022  Lab Results  Component Value Date   INSULIN 22.9 12/22/2022   No results found for: "TSH" Lab Results  Component Value Date   CHOL 202 (A) 11/11/2022   HDL 39 11/11/2022   LDLCALC 130 11/11/2022   TRIG 165 (A) 11/11/2022   Lab Results  Component Value Date   VD25OH 18.2 (L) 12/22/2022   Lab Results  Component Value Date   WBC 7.2 11/11/2022   HGB 14.5 11/11/2022   HCT 43 11/11/2022   PLT 369 11/11/2022   No results found for: "IRON", "TIBC",  "FERRITIN"  Attestation Statements:   Reviewed by clinician on day of visit: allergies, medications, problem list, medical history, surgical history, family history, social history, and previous encounter notes.   I, Burt Knack, am acting as transcriptionist for Reuben Likes, MD.  I have reviewed the above documentation for accuracy and completeness, and I agree with the above. - Reuben Likes, MD

## 2023-02-23 ENCOUNTER — Ambulatory Visit (INDEPENDENT_AMBULATORY_CARE_PROVIDER_SITE_OTHER): Payer: 59 | Admitting: Family Medicine

## 2023-03-02 ENCOUNTER — Telehealth: Payer: Self-pay

## 2023-03-02 NOTE — Telephone Encounter (Signed)
Please see MyChart message from today 03/02/2023

## 2023-03-03 ENCOUNTER — Ambulatory Visit: Payer: 59 | Admitting: Family Medicine

## 2023-03-03 ENCOUNTER — Encounter: Payer: Self-pay | Admitting: Family Medicine

## 2023-03-03 VITALS — BP 103/73 | HR 77 | Ht 70.0 in | Wt 226.5 lb

## 2023-03-03 DIAGNOSIS — G4733 Obstructive sleep apnea (adult) (pediatric): Secondary | ICD-10-CM | POA: Diagnosis not present

## 2023-03-03 NOTE — Progress Notes (Signed)
PATIENT: Megan Bryant DOB: 11/04/1965  REASON FOR VISIT: follow up HISTORY FROM: patient  Chief Complaint  Patient presents with   Follow-up    Pt in room 2. Here for cpap follow up. Pt wears every night, pt said sometimes she wakes up and the machine is off. Pt said she was told that she still snores with mask on.       HISTORY OF PRESENT ILLNESS:  03/03/23 ALL:  Megan Bryant returns for follow up for OSA on CPAP. She continues to do well on therapy. She is using therapy most nights for about 7.5 hours, on average. She report that about a month ago, her machine started cutting off at night. Usually happens a couple times a week. She has to physically cut machine back on. She does occasionally snore. She is using FFM. AHI always well managed.     02/25/2022 ALL: Megan Bryant returns for follow up for OSA on CPAP. She is doing well on therapy. She is using CPAP nightly for about 6.5 hours. She does note headaches if not using CPAP. She denies concerns with machine or supplies.     04/28/2021 ALL (Mychart): Megan Bryant is a 57 y.o. female here today for follow up for initial CPAP. She was seen 02/19/2021 but visit was too early to qualify for compliance review per insurance. She continues to do well on CPAP. She does have nights where she wakes to use the restroom and can not get back to sleep with therapy. She alternates melatonin 5mg  and benadryl 25mg  which seems to help. She has had more allergy symptoms that has limited 4 hour use over the past few weeks. She feels that current mask is the best fit for her. She does occasionally have a little air leak. She does feel better rested. She feels that she is waking earlier and ready to get up.    Compliance report dated 03/09/2021-04/07/2021 shows she used CPAP 30/30 days for compliance of 100%. She used CPAP greater than 4 hours 24/30 days for compliance of 80%. Average usage was 5.6hr. Residual AHI was 0.6/hr on 6-16cmH20. No  significant leak noted.   02/19/2021 ALL: Megan Bryant is a 57 y.o. female patient of Dr Dohmeier here today for follow up for OSA on CPAP.  HST showed severe OSA with AHI of 33.9/hr and O2 nadir of 58%. She feels that she is doing well with CPAP. She is not tossing and turning as much. She does feel that she is having more pain due to being more still. She is getting more sleep. She is going to bed sooner and waking a little later. She is using Airtouch F20 M FFM  but concerned about possible air leak.   Unfortunately, we do not have any data to review for today's visit. She has an ibreeze Higher education careers adviser. Set up date 02/07/21.    HISTORY: (copied from Dr Dohmeier's previous note)  HPI:  Megan Bryant is a 57 y.o. female , seen here in a referral visit  from Dr. Timothy Lasso for evaluation of sleep apnea. 11-20-2020. All through COVID she has struggled with not being motivated and getting fragmented sleep only, weight gain during the pandemic. She is very inactive, unmotivated. We ordered a HST almost 3 years ago, the order was never completed. 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 She craves new energy.    She still struggles with getting solid sleep. She had been seen 01-2018, previously  and completed the "  sleep bootcamp" and melatonin -  Gets 4-5 hours of broken sleep. She has never had a SS. She does snore in sleep and if she wakes up can take a long time before she is able to fall back asleep. Her dad has OSA. She has noticed restlessness in legs.    Megan Bryant reports that she doubts she has sleep apnea, she would just be a light sleeper, her spouse reported her to snore but has not mentioned any pauses. Her husband sleeps in a separate room, as she snores loudly and kicks a lot - he has OSA but failed CPAP. She still switches on the TV at night. She sleeps with the Tv on- has a high level of anxiety, and keeps her from worries.   Chief complaint according to patient : see above   Sleep  habits are as follows: she watches TV before she goes to bed and in the bedroom. She will be in bed by 10 pm, but sleeps not earlier than between 11 and midnight.  She considers herself prone to worry and often have fluid ruminating same thoughts.  She also is a light sleeper and somewhat restless.  She has 1 or 2 times nocturia, her bedroom is now quieter, cooler, and mostly dark (TV). Megan Bryant avoids prone sleep sometimes sleeps on her back, but mostly on her sides.  She sleeps with 4 pillows.  on a flat firm mattress.   She dreams but not excessively- no nightmare content. She usually wakes up at 8 AM spontaneously but her sleep is not as refreshing and restoring as she would like it to be.  Sometimes she has palpitations related to anxiety, occasional palpitations have letter to wake up from sleep.  She wakes frequently up with headaches,  headaches can also wake her out of sleep. She has migraines less since she d/c allergy medication- zyrtec.  Dry mouth is better.   Sleep medical history and family sleep history:  Father with atrial fib and OSA, sister had CHF diastolic, atrial fib- MI.    She has one sister- healthy. The patient had no Tonsillectomy , no ENT surgery and no trauma to head and neck.  Depression and fatigue go hand in hand.    Social history: married, 2 children- 18 , soon to graduate -and 89 years old, graduating [employer]. 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 Non smoker, ETOH- 2-3 a year. Caffeine: iced tea daily- it's decaff. Sodas - used to drink daily, now 4-5 a week, often to combat headaches/ Neck pain. My next advise was regarding to sleep hygiene I would like her to make a routine plan when to go to bed and to switch all electronics including the TV off 30 minutes before she starts her 14.  If she has any worries that night any ideas she should commit them to paper and just  delegate for the next day I like her to turn AROUND so that they are not visible and all blue or gray light emitting electric devices should not be in the bedroom or covered by a cloth.   She does prefer a cooler temperature in her bedroom which is usually helpful for sleep.  I also recommended that my patients take a hot bath or shower before bedtime as the increased core temperature helps to maintain and sustain sleep during the night.   REVIEW OF SYSTEMS:  Out of a complete 14 system review of symptoms, the patient complains only of the following symptoms, hip pain and all other reviewed systems are negative.  ESS: 5/24, previously 6/24 FSS: not completed   ALLERGIES: Allergies  Allergen Reactions   Neurontin [Gabapentin] Other (See Comments)    tachycardia   Sulfonamide Derivatives     Pt doesn't remember   Xanax [Alprazolam]    Penicillins Swelling    HOME MEDICATIONS: Outpatient Medications Prior to Visit  Medication Sig Dispense Refill   busPIRone (BUSPAR) 5 MG tablet Take 5 mg by mouth 2 (two) times daily.     Esomeprazole Magnesium (NEXIUM PO) Take 1 Dose by mouth as needed.     famotidine (PEPCID) 10 MG tablet Take 10 mg by mouth daily.     FLUoxetine (PROZAC) 10 MG capsule Take 1 tablet by mouth daily.     ibuprofen (ADVIL) 800 MG tablet Take 1 tablet by mouth every 8 (eight) hours as needed.     melatonin 5 MG TABS Take 5 mg by mouth at bedtime as needed.     UNABLE TO FIND Take 1 Dose by mouth daily. Med Name: Nature's Bounty probiotic, 20 billion live cultures     Vitamin D, Ergocalciferol, (DRISDOL) 1.25 MG (50000 UNIT) CAPS capsule Take 1 capsule (50,000 Units total) by mouth every 7 (seven) days. 4 capsule 0   UNABLE TO FIND Med Name: Nature's Bounty Immune 24 hr+,  Vitamin A -            Biotin -300mg  Vitamin D -              Magnesium- Vitamin B6-2mg                  Zinc-15mg  Vitamin B12 -            Ester C-100mg  (Patient not taking:  Reported on 03/03/2023)     No facility-administered medications prior to visit.    PAST MEDICAL HISTORY: Past Medical History:  Diagnosis Date   Anxiety    Bilateral swelling of feet and ankles    Chest pain    Depression    Dry skin    Fatigue    Gallbladder problem    Headache    Heartburn    High cholesterol    Hypertension    Insomnia    Joint pain    Knee pain    Lactose intolerance    Low back pain    Palpitations    Skin rash    Sleep apnea    SOB (shortness of breath)     PAST SURGICAL HISTORY: Past Surgical History:  Procedure Laterality Date   CESAREAN SECTION  2000,2003   CHOLECYSTECTOMY  2001    FAMILY HISTORY: Family History  Problem Relation Age of Onset   Hyperlipidemia Mother    Hypertension Mother    Alzheimer's disease Mother    Stroke Mother    Obesity Mother    Heart disease Father    COPD Father    Diabetes Father    Atrial fibrillation Father    Stroke Father    Anxiety disorder Father    Depression Father    Sleep apnea Father    Diabetes Paternal Grandmother    Colon cancer Neg Hx    Breast cancer Neg Hx     SOCIAL HISTORY: Social History   Socioeconomic History   Marital status: Married    Spouse name: Not on file   Number  of children: Not on file   Years of education: Not on file   Highest education level: Not on file  Occupational History   Occupation: Real Astronomer  Tobacco Use   Smoking status: Never   Smokeless tobacco: Never  Substance and Sexual Activity   Alcohol use: No    Alcohol/week: 0.0 standard drinks of alcohol   Drug use: No   Sexual activity: Not on file  Other Topics Concern   Not on file  Social History Narrative   Not on file   Social Determinants of Health   Financial Resource Strain: Not on file  Food Insecurity: Not on file  Transportation Needs: Not on file  Physical Activity: Not on file  Stress: Not on file  Social Connections: Not on file  Intimate Partner  Violence: Not on file     PHYSICAL EXAM  Vitals:   03/03/23 1017  BP: 103/73  Pulse: 77  Weight: 226 lb 8 oz (102.7 kg)  Height: 5\' 10"  (1.778 m)    Body mass index is 32.5 kg/m.  Generalized: Well developed, in no acute distress  Cardiology: normal rate and rhythm, no murmur noted Respiratory: clear to auscultation bilaterally  Neurological examination  Mentation: Alert oriented to time, place, history taking. Follows all commands speech and language fluent Cranial nerve II-XII: Pupils were equal round reactive to light. Extraocular movements were full, visual field were full  Motor: The motor testing reveals 5 over 5 strength of all 4 extremities. Good symmetric motor tone is noted throughout.  Gait and station: Gait is normal.    DIAGNOSTIC DATA (LABS, IMAGING, TESTING) - I reviewed patient records, labs, notes, testing and imaging myself where available.      No data to display           Lab Results  Component Value Date   WBC 7.2 11/11/2022   HGB 14.5 11/11/2022   HCT 43 11/11/2022   PLT 369 11/11/2022      Component Value Date/Time   NA 143 11/11/2022 0000   K 4.7 11/11/2022 0000   CL 107 11/11/2022 0000   CO2 25 (A) 11/11/2022 0000   BUN 12 11/11/2022 0000   CREATININE 0.7 11/11/2022 0000   CALCIUM 8.6 (A) 11/11/2022 0000   ALBUMIN 3.6 11/11/2022 0000   AST 20 11/11/2022 0000   ALT 24 11/11/2022 0000   ALKPHOS 104 11/11/2022 0000   GFRNONAA 86.6 12/22/2022 1046   Lab Results  Component Value Date   CHOL 202 (A) 11/11/2022   HDL 39 11/11/2022   LDLCALC 130 11/11/2022   TRIG 165 (A) 11/11/2022   Lab Results  Component Value Date   HGBA1C 6.2 (H) 12/22/2022   Lab Results  Component Value Date   VITAMINB12 462 11/11/2022   No results found for: "TSH"   ASSESSMENT AND PLAN 57 y.o. year old female  has a past medical history of Anxiety, Bilateral swelling of feet and ankles, Chest pain, Depression, Dry skin, Fatigue, Gallbladder problem,  Headache, Heartburn, High cholesterol, Hypertension, Insomnia, Joint pain, Knee pain, Lactose intolerance, Low back pain, Palpitations, Skin rash, Sleep apnea, and SOB (shortness of breath). here with     ICD-10-CM   1. OSA on CPAP  G47.33 For home use only DME continuous positive airway pressure (CPAP)      Megan Bryant is doing well on CPAP therapy. Compliance data shows excellent compliance. She was encouraged to continue using CPAP nightly and for greater than 4  hours each night. Risks of untreated sleep apnea review and education materials provided. I have advised she call DME to have her machine checked out. Healthy lifestyle habits encouraged. She will follow up pending review of download. She verbalizes understanding and agreement with this plan.    Orders Placed This Encounter  Procedures   For home use only DME continuous positive airway pressure (CPAP)    Supplies    Order Specific Question:   Length of Need    Answer:   Lifetime    Order Specific Question:   Patient has OSA or probable OSA    Answer:   Yes    Order Specific Question:   Is the patient currently using CPAP in the home    Answer:   Yes    Order Specific Question:   Settings    Answer:   Other see comments    Order Specific Question:   CPAP supplies needed    Answer:   Mask, headgear, cushions, filters, heated tubing and water chamber      No orders of the defined types were placed in this encounter.      Shawnie Dapper, FNP-C 03/03/2023, 11:44 AM [facility] [address] [place], [place] [address] [phone]

## 2023-03-03 NOTE — Patient Instructions (Signed)
Please continue using your CPAP regularly. While your insurance requires that you use CPAP at least 4 hours each night on 70% of the nights, I recommend, that you not skip any nights and use it throughout the night if you can. Getting used to CPAP and staying with the treatment long term does take time and patience and discipline. Untreated obstructive sleep apnea when it is moderate to severe can have an adverse impact on cardiovascular health and raise her risk for heart disease, arrhythmias, hypertension, congestive heart failure, stroke and diabetes. Untreated obstructive sleep apnea causes sleep disruption, nonrestorative sleep, and sleep deprivation. This can have an impact on your day to day functioning and cause daytime sleepiness and impairment of cognitive function, memory loss, mood disturbance, and problems focussing. Using CPAP regularly can improve these symptoms.  We will update supply orders, today. Please reach out to your DME to ask about the machine cutting off at night.   Follow up in 1 year

## 2023-03-07 ENCOUNTER — Ambulatory Visit (INDEPENDENT_AMBULATORY_CARE_PROVIDER_SITE_OTHER): Payer: 59 | Admitting: Family Medicine

## 2023-03-07 ENCOUNTER — Encounter (INDEPENDENT_AMBULATORY_CARE_PROVIDER_SITE_OTHER): Payer: Self-pay | Admitting: Family Medicine

## 2023-03-07 VITALS — BP 115/70 | HR 67 | Temp 98.0°F | Ht 70.0 in | Wt 220.0 lb

## 2023-03-07 DIAGNOSIS — E559 Vitamin D deficiency, unspecified: Secondary | ICD-10-CM | POA: Diagnosis not present

## 2023-03-07 DIAGNOSIS — Z6831 Body mass index (BMI) 31.0-31.9, adult: Secondary | ICD-10-CM | POA: Diagnosis not present

## 2023-03-07 DIAGNOSIS — E669 Obesity, unspecified: Secondary | ICD-10-CM | POA: Diagnosis not present

## 2023-03-07 DIAGNOSIS — R7303 Prediabetes: Secondary | ICD-10-CM | POA: Diagnosis not present

## 2023-03-07 NOTE — Progress Notes (Signed)
Chief Complaint:   OBESITY Megan Bryant is here to discuss her progress with her obesity treatment plan along with follow-up of her obesity related diagnoses. Megan Bryant is on the Category 3 Plan and states she is following her eating plan approximately 80% of the time. Megan Bryant states she is playing pickle ball for 60-75 minutes 1 time per week.  Today's visit was #: 5 Starting weight: 239 lbs Starting date: 12/22/2022 Today's weight: 220 lbs Today's date: 03/07/2023 Total lbs lost to date: 19 Total lbs lost since last in-office visit: 4  Interim History: Since last appointment patient went to the mountains for a girls trip.  She went to her cousins 31th birthday party. She felt like after getting back from the mountains she felt hungrier than she had previously.  Next few weeks she has a baby shower in September and a vacation for a week in September.    Subjective:   1. Prediabetes Patient's last A1c was 6.2 and insulin 22.9.  She is not on medications, but she is having more cravings and food desires in the evening after supper.  2. Vitamin D deficiency Patient is on prescription vitamin D.  She denies nausea, vomiting, or muscle weakness but notes fatigue.  Assessment/Plan:   1. Prediabetes We will follow-up with the patient about medication initiation at her next appointment.  2. Vitamin D deficiency Patient will continue prescription vitamin D 50,000 IU once weekly.  3. BMI 31.0-31.9,adult  4. Obesity with starting BMI of 34.4 Megan Bryant is currently in the action stage of change. As such, her goal is to continue with weight loss efforts. She has agreed to the Category 3 Plan.   Exercise goals: As is.   Behavioral modification strategies: increasing lean protein intake, meal planning and cooking strategies, keeping healthy foods in the home, and planning for success.  Megan Bryant has agreed to follow-up with our clinic in 3 weeks. She was informed of the importance of  frequent follow-up visits to maximize her success with intensive lifestyle modifications for her multiple health conditions.   Objective:   Blood pressure 115/70, pulse 67, temperature 98 F (36.7 C), height 5\' 10"  (1.778 m), weight 220 lb (99.8 kg), SpO2 100%. Body mass index is 31.57 kg/m.  General: Cooperative, alert, well developed, in no acute distress. HEENT: Conjunctivae and lids unremarkable. Cardiovascular: Regular rhythm.  Lungs: Normal work of breathing. Neurologic: No focal deficits.   Lab Results  Component Value Date   CREATININE 0.7 11/11/2022   BUN 12 11/11/2022   NA 143 11/11/2022   K 4.7 11/11/2022   CL 107 11/11/2022   CO2 25 (A) 11/11/2022   Lab Results  Component Value Date   ALT 24 11/11/2022   AST 20 11/11/2022   ALKPHOS 104 11/11/2022   Lab Results  Component Value Date   HGBA1C 6.2 (H) 12/22/2022   Lab Results  Component Value Date   INSULIN 22.9 12/22/2022   No results found for: "TSH" Lab Results  Component Value Date   CHOL 202 (A) 11/11/2022   HDL 39 11/11/2022   LDLCALC 130 11/11/2022   TRIG 165 (A) 11/11/2022   Lab Results  Component Value Date   VD25OH 18.2 (L) 12/22/2022   Lab Results  Component Value Date   WBC 7.2 11/11/2022   HGB 14.5 11/11/2022   HCT 43 11/11/2022   PLT 369 11/11/2022   No results found for: "IRON", "TIBC", "FERRITIN"  Attestation Statements:   Reviewed by clinician on day of  visit: allergies, medications, problem list, medical history, surgical history, family history, social history, and previous encounter notes.  Time spent on visit including pre-visit chart review and post-visit care and charting was 30 minutes.   I, Burt Knack, am acting as transcriptionist for Reuben Likes, MD.  I have reviewed the above documentation for accuracy and completeness, and I agree with the above. - Reuben Likes, MD

## 2023-03-23 ENCOUNTER — Other Ambulatory Visit (INDEPENDENT_AMBULATORY_CARE_PROVIDER_SITE_OTHER): Payer: Self-pay | Admitting: Family Medicine

## 2023-03-23 DIAGNOSIS — E559 Vitamin D deficiency, unspecified: Secondary | ICD-10-CM

## 2023-03-30 ENCOUNTER — Ambulatory Visit (INDEPENDENT_AMBULATORY_CARE_PROVIDER_SITE_OTHER): Payer: 59 | Admitting: Family Medicine

## 2023-04-01 ENCOUNTER — Other Ambulatory Visit (INDEPENDENT_AMBULATORY_CARE_PROVIDER_SITE_OTHER): Payer: Self-pay | Admitting: Family Medicine

## 2023-04-01 DIAGNOSIS — E559 Vitamin D deficiency, unspecified: Secondary | ICD-10-CM

## 2023-04-04 ENCOUNTER — Encounter (INDEPENDENT_AMBULATORY_CARE_PROVIDER_SITE_OTHER): Payer: Self-pay | Admitting: Family Medicine

## 2023-04-04 ENCOUNTER — Ambulatory Visit (INDEPENDENT_AMBULATORY_CARE_PROVIDER_SITE_OTHER): Payer: 59 | Admitting: Family Medicine

## 2023-04-04 VITALS — BP 129/72 | HR 68 | Temp 97.6°F | Ht 70.0 in | Wt 220.0 lb

## 2023-04-04 DIAGNOSIS — Z6831 Body mass index (BMI) 31.0-31.9, adult: Secondary | ICD-10-CM

## 2023-04-04 DIAGNOSIS — E559 Vitamin D deficiency, unspecified: Secondary | ICD-10-CM

## 2023-04-04 DIAGNOSIS — R7303 Prediabetes: Secondary | ICD-10-CM

## 2023-04-04 DIAGNOSIS — E669 Obesity, unspecified: Secondary | ICD-10-CM

## 2023-04-04 MED ORDER — METFORMIN HCL 500 MG PO TABS: 500.0000 mg | ORAL_TABLET | Freq: Every day | ORAL | 0 refills | Status: DC

## 2023-04-04 MED ORDER — VITAMIN D (ERGOCALCIFEROL) 1.25 MG (50000 UNIT) PO CAPS: 50000.0000 [IU] | ORAL_CAPSULE | ORAL | 0 refills | Status: DC

## 2023-04-04 NOTE — Progress Notes (Signed)
[facility]  Office: [phone]  /  Fax: [phone]  WEIGHT SUMMARY AND BIOMETRICS  Anthropometric Measurements Height: 5\' 10"  (1.778 m) Weight: 220 lb (99.8 kg) BMI (Calculated): 31.57 Weight at Last Visit: 220 lb Starting Weight: 239 lb   Body Composition  Body Fat %: 40.6 % Fat Mass (lbs): 89.4 lbs Muscle Mass (lbs): 124.2 lbs Total Body Water (lbs): 84.8 lbs Visceral Fat Rating : 10   Other Clinical Data Today's Visit #: 6 Starting Date: 12/22/22    Chief Complaint: OBESITY   Discussed the use of AI scribe software for clinical note transcription with the patient, who gave verbal consent to proceed.  History of Present Illness   The patient is a 57 year old female who presents for a follow-up consultation regarding her obesity. Over the past month, the patient has maintained her weight and adhered to her category 3 eating plan approximately 80% of the time. She reports engaging in regular physical activity, playing pickleball for one and a half to two hours three times per week.  The patient has a history of vitamin D deficiency and is currently on prescription vitamin D 50,000 international units weekly.    She has lost 19 pounds since starting her weight loss journey but has recently hit a plateau, which she attributes to increased hunger due to her increased physical activity. She notes a particular craving for food at night, often resorting to nuts and seeds, which she believes contribute to weight gain.  The patient also reports a history of stress and anxiety, which she manages with medication. She has experienced multiple family deaths in the past year, which has been challenging. She has started taking magnesium for constipation and leg pain after physical activity.  The patient's breakfast primarily consists of eggs, bread, yogurt, and occasionally uncured bacon for breakfast. She also consumes a significant amount of poultry and fish, particularly salmon. She  admits to not eating many vegetables, aside from salads. She has been trying to consume low sodium deli meats for her sandwiches.  The patient has a family history of diabetes and heart disease. Her father was diabetic, and her sister had a heart attack at the age of 47. Despite these genetic predispositions, the patient does not have heart issues or diabetes. However, she is aware of her risk and is proactive in managing her health.          PHYSICAL EXAM:  Blood pressure 129/72, pulse 68, temperature 97.6 F (36.4 C), height 5\' 10"  (1.778 m), weight 220 lb (99.8 kg), SpO2 97%. Body mass index is 31.57 kg/m.  DIAGNOSTIC DATA REVIEWED:  BMET    Component Value Date/Time   NA 143 11/11/2022 0000   K 4.7 11/11/2022 0000   CL 107 11/11/2022 0000   CO2 25 (A) 11/11/2022 0000   BUN 12 11/11/2022 0000   CREATININE 0.7 11/11/2022 0000   CALCIUM 8.6 (A) 11/11/2022 0000   GFRNONAA 86.6 12/22/2022 1046   Lab Results  Component Value Date   HGBA1C 6.2 (H) 12/22/2022   Lab Results  Component Value Date   INSULIN 22.9 12/22/2022   No results found for: "TSH" CBC    Component Value Date/Time   WBC 7.2 11/11/2022 0000   RBC 4.9 11/11/2022 0000   HGB 14.5 11/11/2022 0000   HCT 43 11/11/2022 0000   PLT 369 11/11/2022 0000   Iron Studies No results found for: "IRON", "TIBC", "FERRITIN", "IRONPCTSAT" Lipid Panel     Component Value Date/Time   CHOL 202 (  A) 11/11/2022 0000   TRIG 165 (A) 11/11/2022 0000   HDL 39 11/11/2022 0000   LDLCALC 130 11/11/2022 0000   Hepatic Function Panel     Component Value Date/Time   ALBUMIN 3.6 11/11/2022 0000   AST 20 11/11/2022 0000   ALT 24 11/11/2022 0000   ALKPHOS 104 11/11/2022 0000   No results found for: "TSH" Nutritional Lab Results  Component Value Date   VD25OH 18.2 (L) 12/22/2022     Assessment and Plan    Obesity Patient has lost 19 pounds since starting the program but has plateaued recently. She reports increased  hunger due to increased physical activity (pickleball) and struggles with nighttime eating. Discussed the role of insulin and prediabetes in weight loss resistance. -Continue category 3 eating plan and current exercise. OK to eat an additional 100Calories if hunger is an issue, especially on exercise days.   Prediabetes -Start Metformin to help manage insulin levels and aid in weight loss. Take with a meal, preferably lunch, to help with nighttime hunger. -Continue current diet and exercise regimen. -Consider strategies for managing nighttime hunger and cravings.  Vitamin D deficiency Patient is on prescription Vitamin D 50,000 international units weekly. -Continue current regimen.  Follow-up Next appointment scheduled for October 7th. Recommended scheduling November appointment as well due to anticipated busy schedule.         I have personally spent 37 minutes total time today in preparation, patient care, and documentation for this visit, including the following: review of clinical lab tests; review of medical tests/procedures/services.    She was informed of the importance of frequent follow up visits to maximize her success with intensive lifestyle modifications for her multiple health conditions.    Quillian Quince, MD

## 2023-04-10 IMAGING — MG MM DIGITAL SCREENING BILAT W/ TOMO AND CAD
6 of 10 series · 6 of 30 positions shown · non-contrast
Comparison: Previous exam(s).

CLINICAL DATA: Screening.

EXAM:
DIGITAL SCREENING BILATERAL MAMMOGRAM WITH TOMOSYNTHESIS AND CAD
TECHNIQUE: Bilateral screening digital craniocaudal and mediolateral oblique
mammograms were obtained. Bilateral screening digital breast
tomosynthesis was performed. The images were evaluated with
computer-aided detection.

[R CC synth-2D]
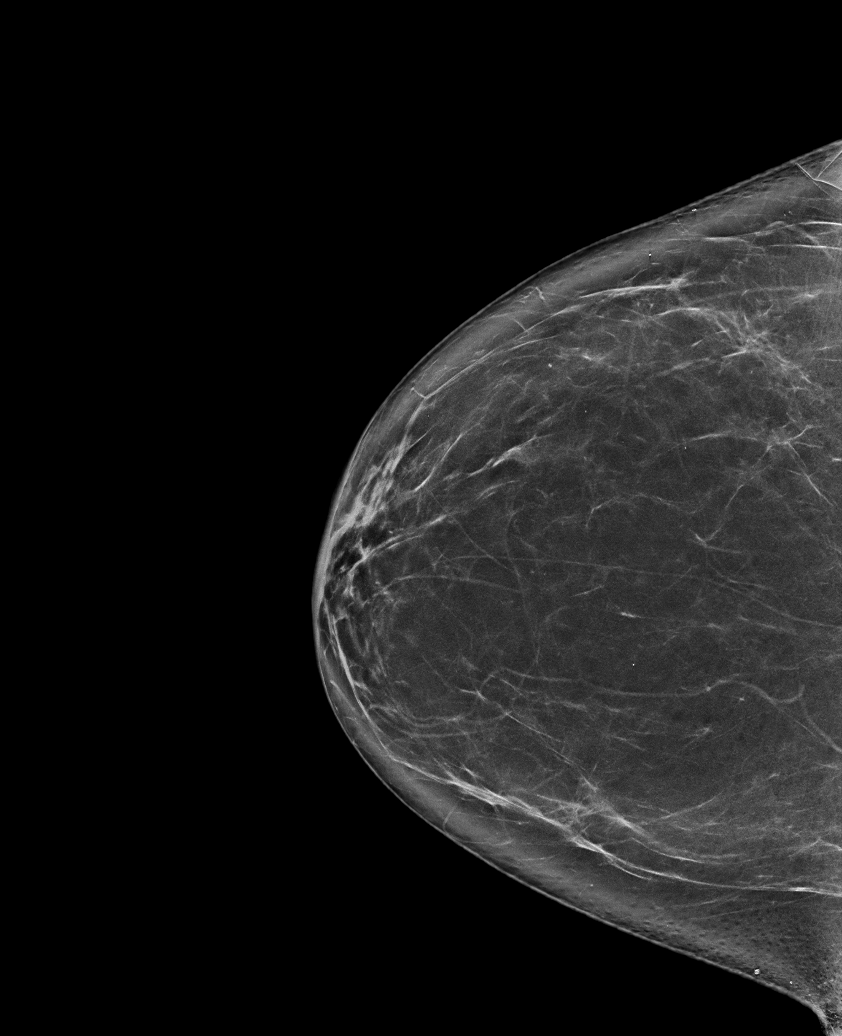

[L CC synth-2D]
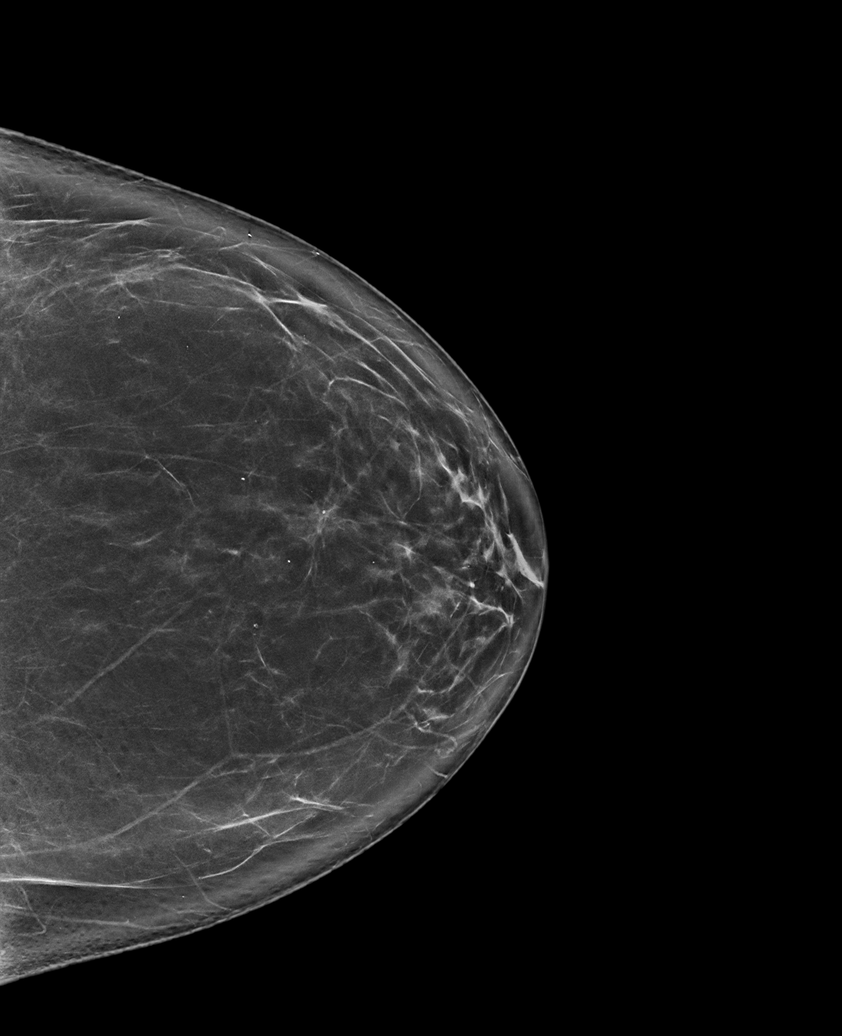

[R CV synth-2D]
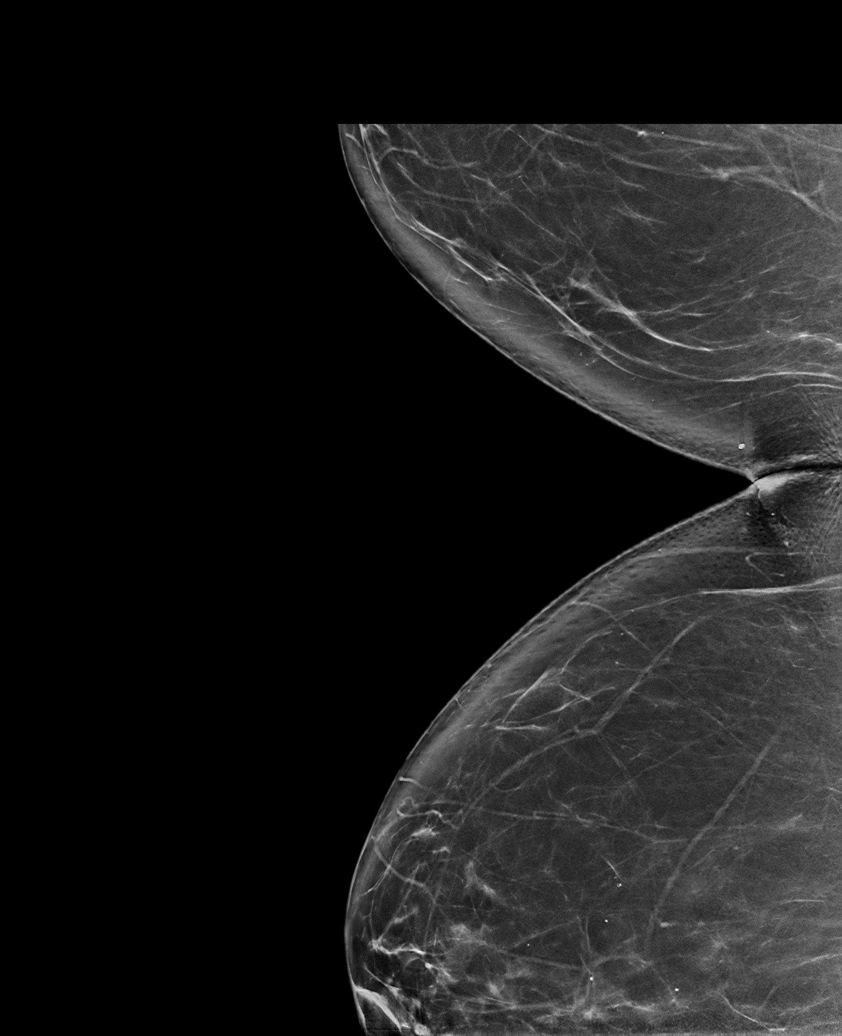

[R MLO synth-2D]
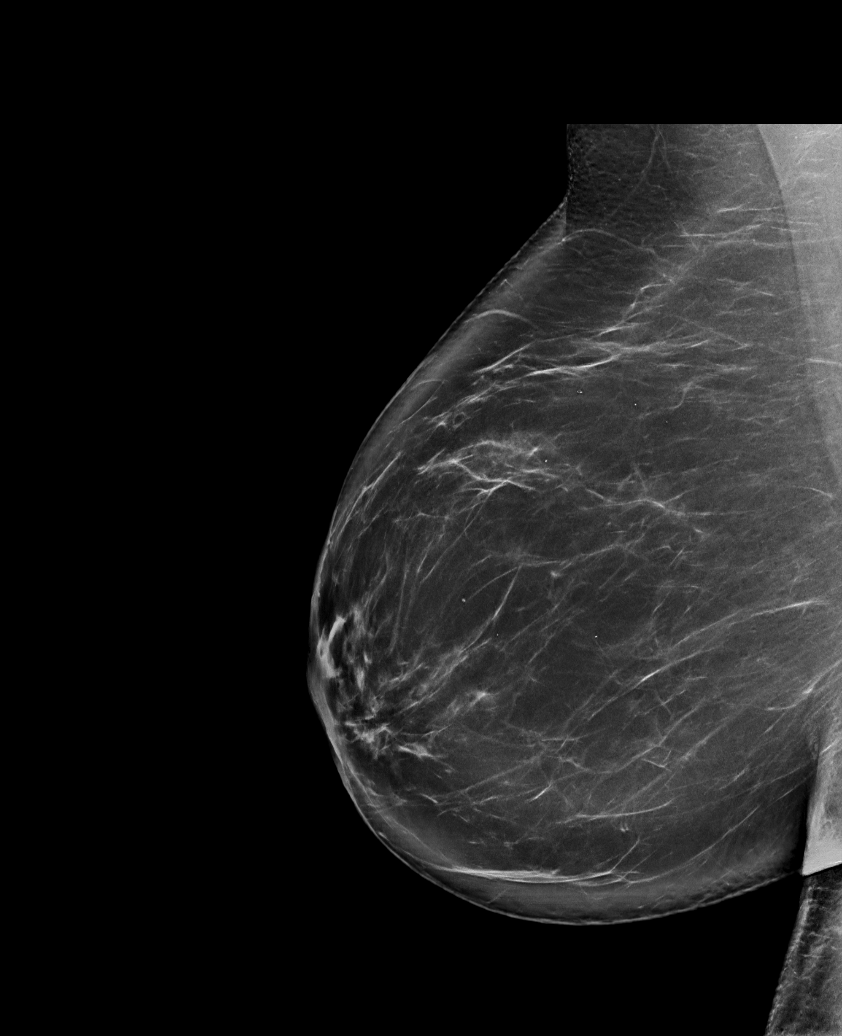

[L MLO synth-2D]
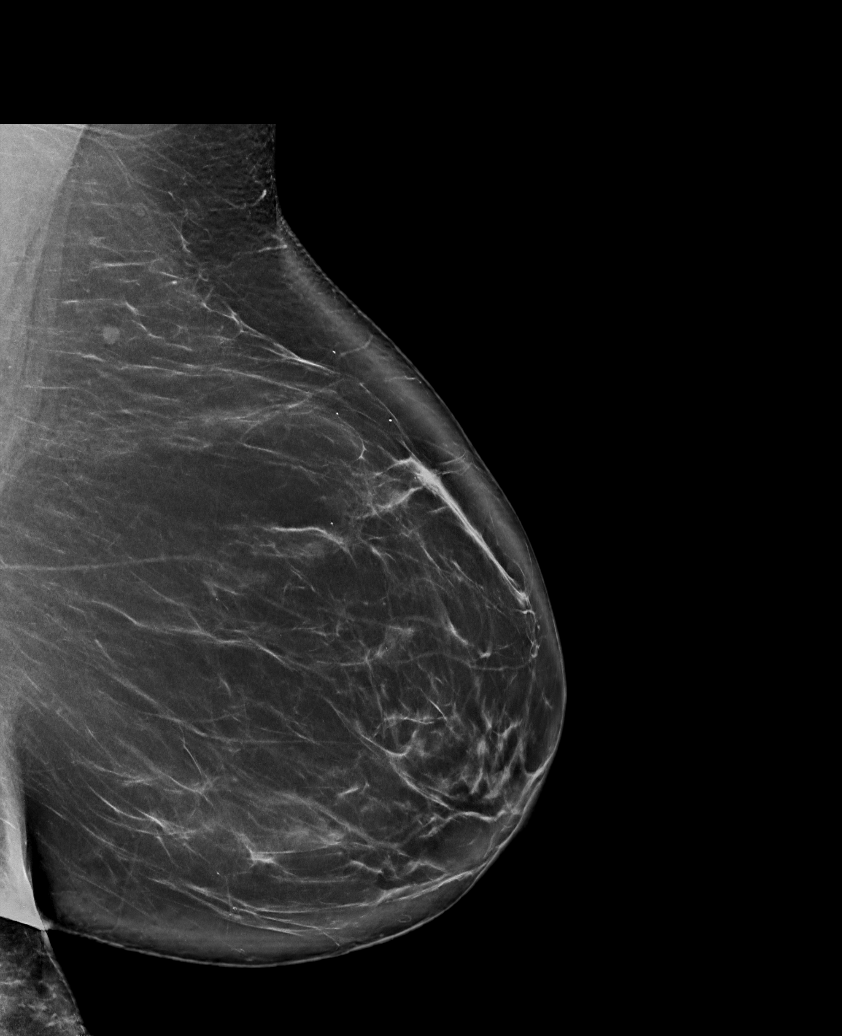

[L CC tomo · tomo slice 44/87.0]
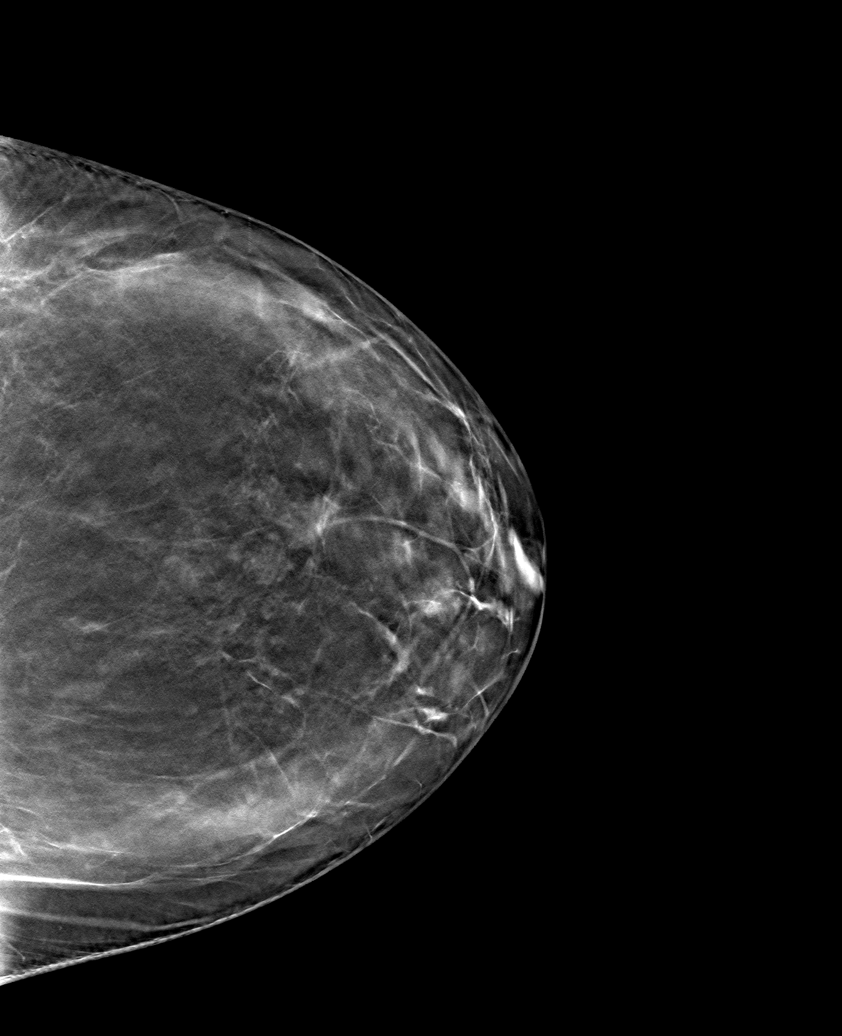

[6 of 30 positions shown; findings below may reference images not displayed]

ACR Breast Density Category b: There are scattered areas of
fibroglandular density.
FINDINGS: There are no findings suspicious for malignancy. The images were
evaluated with computer-aided detection.
IMPRESSION: No mammographic evidence of malignancy. A result letter of this
screening mammogram will be mailed directly to the patient.

RECOMMENDATION:
Screening mammogram in one year. (Code:[id number]-[id number])

BI-RADS CATEGORY  1: Negative.

## 2023-05-02 ENCOUNTER — Ambulatory Visit (INDEPENDENT_AMBULATORY_CARE_PROVIDER_SITE_OTHER): Payer: 59 | Admitting: Family Medicine

## 2023-05-02 ENCOUNTER — Encounter (INDEPENDENT_AMBULATORY_CARE_PROVIDER_SITE_OTHER): Payer: Self-pay | Admitting: Family Medicine

## 2023-05-02 VITALS — BP 116/69 | HR 70 | Temp 97.8°F | Ht 70.0 in | Wt 217.0 lb

## 2023-05-02 DIAGNOSIS — E669 Obesity, unspecified: Secondary | ICD-10-CM | POA: Diagnosis not present

## 2023-05-02 DIAGNOSIS — Z6834 Body mass index (BMI) 34.0-34.9, adult: Secondary | ICD-10-CM

## 2023-05-02 DIAGNOSIS — R7303 Prediabetes: Secondary | ICD-10-CM

## 2023-05-02 DIAGNOSIS — Z6831 Body mass index (BMI) 31.0-31.9, adult: Secondary | ICD-10-CM

## 2023-05-02 DIAGNOSIS — E559 Vitamin D deficiency, unspecified: Secondary | ICD-10-CM | POA: Diagnosis not present

## 2023-05-02 MED ORDER — VITAMIN D (ERGOCALCIFEROL) 1.25 MG (50000 UNIT) PO CAPS: 50000.0000 [IU] | ORAL_CAPSULE | ORAL | 0 refills | Status: DC

## 2023-05-02 NOTE — Progress Notes (Signed)
Chief Complaint:   OBESITY Megan Bryant is here to discuss her progress with her obesity treatment plan along with follow-up of her obesity related diagnoses. Megan Bryant is on the Category 3 Plan and states she is following her eating plan approximately 60-70% of the time. Megan Bryant states she is playing pickle ball for 75 minutes 1 time per week.  Today's visit was #: 7 Starting weight: 239 lbs Starting date: 12/22/2022 Today's weight: 217 lbs Today's date: 05/02/2023 Total lbs lost to date: 22 Total lbs lost since last in-office visit: 3  Interim History: Since last appointment patient does not feel like she followed meal plan very strictly.  She went on vacation and indulged in the traditions she normally partakes in on vacation.  She hasn't been as active due to her pickleball partner being down for surgery.  She feels like she is stuck.  She did not do well with metformin. She does feel the meal plan is somewhat monotonous.   Subjective:   1. Vitamin D deficiency Patient denies nausea, vomiting, or muscle weakness but notes fatigue.  Her last lab was in May.  2. Prediabetes Patient's last A1c was 6.2 and insulin 22.9.  She is not on medications.  Assessment/Plan:   1. Vitamin D deficiency We will refill vitamin D 50,000 IU once weekly for 1 month.  - Vitamin D, Ergocalciferol, (DRISDOL) 1.25 MG (50000 UNIT) CAPS capsule; Take 1 capsule (50,000 Units total) by mouth every 7 (seven) days.  Dispense: 4 capsule; Refill: 0  2. Prediabetes We will repeat labs in November.  Patient will continue her category 3 meal plan, and she was encouraged to start branching out.  3. BMI 31.0-31.9,adult  4. Obesity with starting BMI of 34.4 Megan Bryant is currently in the action stage of change. As such, her goal is to continue with weight loss efforts. She has agreed to the Category 3 Plan.   Exercise goals: All adults should avoid inactivity. Some physical activity is better than none, and  adults who participate in any amount of physical activity gain some health benefits.  Behavioral modification strategies: increasing lean protein intake, meal planning and cooking strategies, keeping healthy foods in the home, and planning for success.  Darin has agreed to follow-up with our clinic in 4 weeks. She was informed of the importance of frequent follow-up visits to maximize her success with intensive lifestyle modifications for her multiple health conditions.   Objective:   Blood pressure 116/69, pulse 70, temperature 97.8 F (36.6 C), height 5\' 10"  (1.778 m), weight 217 lb (98.4 kg), SpO2 99%. Body mass index is 31.14 kg/m.  General: Cooperative, alert, well developed, in no acute distress. HEENT: Conjunctivae and lids unremarkable. Cardiovascular: Regular rhythm.  Lungs: Normal work of breathing. Neurologic: No focal deficits.   Lab Results  Component Value Date   CREATININE 0.7 11/11/2022   BUN 12 11/11/2022   NA 143 11/11/2022   K 4.7 11/11/2022   CL 107 11/11/2022   CO2 25 (A) 11/11/2022   Lab Results  Component Value Date   ALT 24 11/11/2022   AST 20 11/11/2022   ALKPHOS 104 11/11/2022   Lab Results  Component Value Date   HGBA1C 6.2 (H) 12/22/2022   Lab Results  Component Value Date   INSULIN 22.9 12/22/2022   No results found for: "TSH" Lab Results  Component Value Date   CHOL 202 (A) 11/11/2022   HDL 39 11/11/2022   LDLCALC 130 11/11/2022   TRIG 165 (A)  11/11/2022   Lab Results  Component Value Date   VD25OH 18.2 (L) 12/22/2022   Lab Results  Component Value Date   WBC 7.2 11/11/2022   HGB 14.5 11/11/2022   HCT 43 11/11/2022   PLT 369 11/11/2022   No results found for: "IRON", "TIBC", "FERRITIN"  Attestation Statements:   Reviewed by clinician on day of visit: allergies, medications, problem list, medical history, surgical history, family history, social history, and previous encounter notes.   I, Burt Knack, am acting as  transcriptionist for Reuben Likes, MD.  I have reviewed the above documentation for accuracy and completeness, and I agree with the above. - Reuben Likes, MD

## 2023-06-01 ENCOUNTER — Encounter (INDEPENDENT_AMBULATORY_CARE_PROVIDER_SITE_OTHER): Payer: Self-pay | Admitting: Family Medicine

## 2023-06-01 ENCOUNTER — Ambulatory Visit (INDEPENDENT_AMBULATORY_CARE_PROVIDER_SITE_OTHER): Payer: 59 | Admitting: Family Medicine

## 2023-06-01 VITALS — BP 113/68 | HR 68 | Temp 97.6°F | Ht 70.0 in | Wt 220.0 lb

## 2023-06-01 DIAGNOSIS — R7303 Prediabetes: Secondary | ICD-10-CM

## 2023-06-01 DIAGNOSIS — E559 Vitamin D deficiency, unspecified: Secondary | ICD-10-CM | POA: Diagnosis not present

## 2023-06-01 DIAGNOSIS — Z6831 Body mass index (BMI) 31.0-31.9, adult: Secondary | ICD-10-CM | POA: Diagnosis not present

## 2023-06-01 DIAGNOSIS — E66811 Obesity, class 1: Secondary | ICD-10-CM | POA: Diagnosis not present

## 2023-06-01 MED ORDER — VITAMIN D (ERGOCALCIFEROL) 1.25 MG (50000 UNIT) PO CAPS: 50000.0000 [IU] | ORAL_CAPSULE | ORAL | 0 refills | Status: DC

## 2023-06-01 NOTE — Assessment & Plan Note (Signed)
Last A1c 6.2.  She is craving savory carbs like pasta, potatoes, rice.  She is trying to be mindful of food choices and macronutrient content to get close to the 90 or more grams of protein she needs daily.  Labs at next appointment.

## 2023-06-01 NOTE — Assessment & Plan Note (Signed)
Last level in May of 2024 low.  She does still have fatigue but denies headache, nausea and vomiting.  She needs a refill today.  She will need repeat lab at next appointment.

## 2023-06-01 NOTE — Progress Notes (Signed)
SUBJECTIVE:   Chief Complaint: Obesity  Interim History: Patient has been struggling since last appointment.  She still has desire to eat after dinner.  She is bored with dinner and eats the same thing over and over again.  Stress has impacted her.  She feels like she is doing well for breakfast and lunch but things go array at dinner.  She doesn't meal prep. She does many repeat options for meals. Finding herself craving more savory and hearty meals and foods. She is still craving nuts and seeds.  She is going to the [place] for Thanksgiving at the extended buffet.  Has one family get together at the beginning of December.  Darina is here to discuss her progress with her obesity treatment plan. She is on the Category 3 Plan and states she is following her eating plan approximately 75 % of the time. She states she is not exercising.   OBJECTIVE: Visit Diagnoses: Problem List Items Addressed This Visit       Other   Vitamin D deficiency    Last level in May of 2024 low.  She does still have fatigue but denies headache, nausea and vomiting.  She needs a refill today.  She will need repeat lab at next appointment.      Relevant Medications   Vitamin D, Ergocalciferol, (DRISDOL) 1.25 MG (50000 UNIT) CAPS capsule   Prediabetes    Last A1c 6.2.  She is craving savory carbs like pasta, potatoes, rice.  She is trying to be mindful of food choices and macronutrient content to get close to the 90 or more grams of protein she needs daily.  Labs at next appointment.      Class 1 obesity with serious comorbidity and body mass index (BMI) of 34.0 to 34.9 in adult - Primary   Other Visit Diagnoses     BMI 31.0-31.9,adult           Vitals Temp: 97.6 F (36.4 C) BP: 113/68 Pulse Rate: 68 SpO2: 100 %   Anthropometric Measurements Height: 5\' 10"  (1.778 m) Weight: 220 lb (99.8 kg) BMI (Calculated): 31.57 Weight at Last Visit: 217 lb Weight Lost Since Last Visit: 0 Weight Gained  Since Last Visit: 3 Starting Weight: 239 lb Total Weight Loss (lbs): 19 lb (8.618 kg)   Body Composition  Body Fat %: 41.3 % Fat Mass (lbs): 90.8 lbs Muscle Mass (lbs): 122.6 lbs Total Body Water (lbs): 85.2 lbs Visceral Fat Rating : 10   Other Clinical Data Today's Visit #: 8 Starting Date: 12/22/22     ASSESSMENT AND PLAN:  Diet: Zenia is currently in the action stage of change. As such, her goal is to continue with weight loss efforts. She has agreed to Category 3 Plan.  She can also create dinner with the goal of 450-600 calories and 40 or more grams of protein.  The expectation it that patient may not initially meet calorie or protein goals as the nturitional understanding of food intake is begun.  We discussed the 10:1 ratio when reading a food label.     Exercise: Kawanda has been instructed that some exercise is better than none for weight loss and overall health benefits.   Behavior Modification:  We discussed the following Behavioral Modification Strategies today: increasing lean protein intake, decreasing simple carbohydrates, increasing vegetables, and meal planning and cooking strategies.    No follow-ups on file.Marland Kitchen She was informed of the importance of frequent follow up visits to maximize her success with  intensive lifestyle modifications for her multiple health conditions.  Attestation Statements:   Reviewed by clinician on day of visit: allergies, medications, problem list, medical history, surgical history, family history, social history, and previous encounter notes.   Reuben Likes, MD

## 2023-06-08 DIAGNOSIS — G4733 Obstructive sleep apnea (adult) (pediatric): Secondary | ICD-10-CM | POA: Diagnosis not present

## 2023-06-29 ENCOUNTER — Encounter (INDEPENDENT_AMBULATORY_CARE_PROVIDER_SITE_OTHER): Payer: Self-pay | Admitting: Family Medicine

## 2023-06-29 ENCOUNTER — Ambulatory Visit (INDEPENDENT_AMBULATORY_CARE_PROVIDER_SITE_OTHER): Payer: 59 | Admitting: Family Medicine

## 2023-06-29 VITALS — BP 105/64 | HR 69 | Temp 97.8°F | Ht 70.0 in | Wt 217.0 lb

## 2023-06-29 DIAGNOSIS — E782 Mixed hyperlipidemia: Secondary | ICD-10-CM | POA: Diagnosis not present

## 2023-06-29 DIAGNOSIS — R7303 Prediabetes: Secondary | ICD-10-CM | POA: Diagnosis not present

## 2023-06-29 DIAGNOSIS — E66811 Obesity, class 1: Secondary | ICD-10-CM

## 2023-06-29 DIAGNOSIS — D1801 Hemangioma of skin and subcutaneous tissue: Secondary | ICD-10-CM | POA: Diagnosis not present

## 2023-06-29 DIAGNOSIS — E7800 Pure hypercholesterolemia, unspecified: Secondary | ICD-10-CM

## 2023-06-29 DIAGNOSIS — E559 Vitamin D deficiency, unspecified: Secondary | ICD-10-CM | POA: Diagnosis not present

## 2023-06-29 DIAGNOSIS — L218 Other seborrheic dermatitis: Secondary | ICD-10-CM | POA: Diagnosis not present

## 2023-06-29 DIAGNOSIS — Z6831 Body mass index (BMI) 31.0-31.9, adult: Secondary | ICD-10-CM | POA: Diagnosis not present

## 2023-06-29 DIAGNOSIS — D224 Melanocytic nevi of scalp and neck: Secondary | ICD-10-CM | POA: Diagnosis not present

## 2023-06-29 DIAGNOSIS — L821 Other seborrheic keratosis: Secondary | ICD-10-CM | POA: Diagnosis not present

## 2023-06-29 NOTE — Assessment & Plan Note (Signed)
Patient not on cholesterol altering medication.  Her last LDL was elevated at 120. The 10-year ASCVD risk score (Arnett DK, et al., 2019) is: 2.2%*   Values used to calculate the score:     Age: 57 years     Sex: Female     Is Non-Hispanic African American: No     Diabetic: No     Tobacco smoker: No     Systolic Blood Pressure: 105 mmHg     Is BP treated: No     HDL Cholesterol: 39 mg/dL*     Total Cholesterol: 202 mg/dL*     * - Cholesterol units were assumed for this score calculation Repeat FLP today- discuss labs at next appointment.

## 2023-06-29 NOTE — Progress Notes (Signed)
SUBJECTIVE:  Chief Complaint: Obesity  Interim History: Patient voices over the last month she didn't feel she did as well as she did previously.  She went to [place] with her family for Thanksgiving and ate more off plan.  She did weigh at home and it showed a weight gain so felt frustrated.  She is trying to stick to plan as best as she can but voices this is a stressful time of year. She has quite a few office parties and holiday gatherings in the next few weeks.    Megan Bryant is here to discuss her progress with her obesity treatment plan. She is on the Category 3 plan and states she is following her eating plan approximately 70 % of the time. She states she is exercising with weights 20 minutes 2-3 times per week.   OBJECTIVE: Visit Diagnoses: Problem List Items Addressed This Visit       Other   Vitamin D deficiency    Last lab done in May and was significant low at 18.  She has been taking prescription strength Vitamin D since her first follow up.  Repeat lab today.  She needs a refill of her medication today.      Relevant Orders   VITAMIN D 25 Hydroxy (Vit-D Deficiency, Fractures)   Prediabetes - Primary    Patient voices she is still struggling with food noise and thoughts daily.  She feels frustrated with thinking about food constantly.  She is fasting today and is going to get labs redone today.  Last A1c of 6.2 and not on medication.      Relevant Orders   Comprehensive metabolic panel   Hemoglobin A1c   Insulin, random   Class 1 obesity with serious comorbidity and body mass index (BMI) of 34.0 to 34.9 in adult   Mixed hyperlipidemia    Patient not on cholesterol altering medication.  Her last LDL was elevated at 120. The 10-year ASCVD risk score (Arnett DK, et al., 2019) is: 2.2%*   Values used to calculate the score:     Age: 57 years     Sex: Female     Is Non-Hispanic African American: No     Diabetic: No     Tobacco smoker: No     Systolic Blood Pressure:  105 mmHg     Is BP treated: No     HDL Cholesterol: 39 mg/dL*     Total Cholesterol: 202 mg/dL*     * - Cholesterol units were assumed for this score calculation Repeat FLP today- discuss labs at next appointment.      Relevant Orders   Lipid Panel With LDL/HDL Ratio   Other Visit Diagnoses     BMI 31.0-31.9,adult           Vitals Temp: 97.8 F (36.6 C) BP: 105/64 Pulse Rate: 69 SpO2: 97 %   Anthropometric Measurements Height: 5\' 10"  (1.778 m) Weight: 217 lb (98.4 kg) BMI (Calculated): 31.14 Weight at Last Visit: 220 lb Weight Lost Since Last Visit: 3 Weight Gained Since Last Visit: 0 Starting Weight: 239 lb Total Weight Loss (lbs): 22 lb (9.979 kg)   Body Composition  Body Fat %: 40.8 % Fat Mass (lbs): 88.6 lbs Muscle Mass (lbs): 122.4 lbs Total Body Water (lbs): 83.6 lbs Visceral Fat Rating : 10   Other Clinical Data Today's Visit #: 9 Starting Date: 12/22/22     ASSESSMENT AND PLAN:  Diet: Megan Bryant is currently in the action stage of  change. As such, her goal is to continue with weight loss efforts. She has agreed to Category 3 Plan.  Exercise: Megan Bryant has been instructed that some exercise is better than none for weight loss and overall health benefits.   Behavior Modification:  We discussed the following Behavioral Modification Strategies today: increasing lean protein intake, meal planning and cooking strategies, better snacking choices, holiday eating strategies, and planning for success.  No follow-ups on file.Marland Kitchen She was informed of the importance of frequent follow up visits to maximize her success with intensive lifestyle modifications for her multiple health conditions.  Attestation Statements:   Reviewed by clinician on day of visit: allergies, medications, problem list, medical history, surgical history, family history, social history, and previous encounter notes.    Reuben Likes, MD

## 2023-06-29 NOTE — Assessment & Plan Note (Signed)
Last lab done in May and was significant low at 18.  She has been taking prescription strength Vitamin D since her first follow up.  Repeat lab today.  She needs a refill of her medication today.

## 2023-06-29 NOTE — Assessment & Plan Note (Signed)
Patient voices she is still struggling with food noise and thoughts daily.  She feels frustrated with thinking about food constantly.  She is fasting today and is going to get labs redone today.  Last A1c of 6.2 and not on medication.

## 2023-06-30 LAB — COMPREHENSIVE METABOLIC PANEL
ALT: 21 [IU]/L (ref 0–32)
AST: 18 [IU]/L (ref 0–40)
Albumin: 4.4 g/dL (ref 3.8–4.9)
Alkaline Phosphatase: 110 [IU]/L (ref 44–121)
BUN/Creatinine Ratio: 19 (ref 9–23)
BUN: 15 mg/dL (ref 6–24)
Bilirubin Total: 0.3 mg/dL (ref 0.0–1.2)
CO2: 22 mmol/L (ref 20–29)
Calcium: 9.3 mg/dL (ref 8.7–10.2)
Chloride: 106 mmol/L (ref 96–106)
Creatinine, Ser: 0.78 mg/dL (ref 0.57–1.00)
Globulin, Total: 2.7 g/dL (ref 1.5–4.5)
Glucose: 97 mg/dL (ref 70–99)
Potassium: 4.8 mmol/L (ref 3.5–5.2)
Sodium: 143 mmol/L (ref 134–144)
Total Protein: 7.1 g/dL (ref 6.0–8.5)
eGFR: 89 mL/min/{1.73_m2} (ref >59)

## 2023-06-30 LAB — HEMOGLOBIN A1C
Est. average glucose Bld gHb Est-mCnc: 123 mg/dL
Hgb A1c MFr Bld: 5.9 % — ABNORMAL HIGH (ref 4.8–5.6)

## 2023-06-30 LAB — LIPID PANEL WITH LDL/HDL RATIO
Cholesterol, Total: 212 mg/dL — ABNORMAL HIGH (ref 100–199)
HDL: 43 mg/dL (ref >39)
LDL Chol Calc (NIH): 144 mg/dL — ABNORMAL HIGH (ref 0–99)
LDL/HDL Ratio: 3.3 {ratio} — ABNORMAL HIGH (ref 0.0–3.2)
Triglycerides: 138 mg/dL (ref 0–149)
VLDL Cholesterol Cal: 25 mg/dL (ref 5–40)

## 2023-06-30 LAB — VITAMIN D 25 HYDROXY (VIT D DEFICIENCY, FRACTURES): Vit D, 25-Hydroxy: 37 ng/mL (ref 30.0–100.0)

## 2023-06-30 LAB — INSULIN, RANDOM: INSULIN: 20 u[IU]/mL (ref 2.6–24.9)

## 2023-07-13 ENCOUNTER — Other Ambulatory Visit (INDEPENDENT_AMBULATORY_CARE_PROVIDER_SITE_OTHER): Payer: Self-pay | Admitting: Family Medicine

## 2023-07-13 DIAGNOSIS — E559 Vitamin D deficiency, unspecified: Secondary | ICD-10-CM

## 2023-08-01 ENCOUNTER — Encounter (INDEPENDENT_AMBULATORY_CARE_PROVIDER_SITE_OTHER): Payer: Self-pay | Admitting: Family Medicine

## 2023-08-01 ENCOUNTER — Ambulatory Visit (INDEPENDENT_AMBULATORY_CARE_PROVIDER_SITE_OTHER): Payer: 59 | Admitting: Family Medicine

## 2023-08-01 VITALS — BP 129/75 | HR 75 | Temp 97.9°F | Ht 70.0 in | Wt 218.0 lb

## 2023-08-01 DIAGNOSIS — E66811 Obesity, class 1: Secondary | ICD-10-CM

## 2023-08-01 DIAGNOSIS — Z6831 Body mass index (BMI) 31.0-31.9, adult: Secondary | ICD-10-CM

## 2023-08-01 DIAGNOSIS — E559 Vitamin D deficiency, unspecified: Secondary | ICD-10-CM

## 2023-08-01 DIAGNOSIS — R7303 Prediabetes: Secondary | ICD-10-CM

## 2023-08-01 MED ORDER — VITAMIN D (ERGOCALCIFEROL) 1.25 MG (50000 UNIT) PO CAPS: 50000.0000 [IU] | ORAL_CAPSULE | ORAL | 0 refills | Status: DC

## 2023-08-01 NOTE — Assessment & Plan Note (Signed)
 Discussed importance of vitamin d  supplementation.  Vitamin d  supplementation has been shown to decrease fatigue, decrease risk of progression to insulin  resistance and then prediabetes, decreases risk of falling in older age and can even assist in decreasing depressive symptoms in PTSD.   Prescription for Vitamin D  sent in. Recent labs discussed- improved level from Vitamin D  18 to 37.

## 2023-08-01 NOTE — Progress Notes (Signed)
 SUBJECTIVE:  Chief Complaint: Obesity  Interim History: Patient did do some indulgent eating over the holidays.  She mentions that she isn't the be best food planner and she thinks her food choices have been different with her kids being home.  She is currently undergoing a bathroom remodel.  She feels like she may have over did it and is fatigued. Kids are leaving this week. She wants to recommit to the food plan.  Megan Bryant is here to discuss her progress with her obesity treatment plan. She is on the Category 3 Plan and states she is following her eating plan approximately 50 % of the time. She states she played pickleball 1 time.   OBJECTIVE: Visit Diagnoses: Problem List Items Addressed This Visit       Other   Vitamin D  deficiency - Primary   Discussed importance of vitamin d  supplementation.  Vitamin d  supplementation has been shown to decrease fatigue, decrease risk of progression to insulin  resistance and then prediabetes, decreases risk of falling in older age and can even assist in decreasing depressive symptoms in PTSD.   Prescription for Vitamin D  sent in. Recent labs discussed- improved level from Vitamin D  18 to 37.       Relevant Medications   Vitamin D , Ergocalciferol , (DRISDOL ) 1.25 MG (50000 UNIT) CAPS capsule   Prediabetes   A1c improved from 6.2 to 5.9.  We did discuss the importance of monitoring nutrition in terms of calorie to protein ratio.  She wants to be more mindful in food choices and meal planning and prepping. Plan to follow up in 4 weeks to refocus on how patient has done with the goals of prep and planning.  Recent labs discussed today.      Class 1 obesity with serious comorbidity and body mass index (BMI) of 34.0 to 34.9 in adult   Other Visit Diagnoses       BMI 31.0-31.9,adult           Vitals Temp: 97.9 F (36.6 C) BP: 129/75 Pulse Rate: 75 SpO2: 99 %   Anthropometric Measurements Height: 5' 10 (1.778 m) Weight: 218 lb (98.9  kg) BMI (Calculated): 31.28 Weight at Last Visit: 217 lb Weight Lost Since Last Visit: 0 Weight Gained Since Last Visit: 1 Starting Weight: 239 lb Total Weight Loss (lbs): 21 lb (9.526 kg)   Body Composition  Body Fat %: 40.7 % Fat Mass (lbs): 89 lbs Muscle Mass (lbs): 123.2 lbs Total Body Water (lbs): 83.4 lbs Visceral Fat Rating : 10   Other Clinical Data Today's Visit #: 10 Starting Date: 12/22/22     ASSESSMENT AND PLAN:  Diet: Megan Bryant is currently in the action stage of change. As such, her goal is to continue with weight loss efforts. She has agreed to Category 3 Plan.  Her plan is to clean out her refridgerator in 2 days, go grocery shopping in 3 days and to prep over the upcoming weekend.   Exercise: Megan Bryant has been instructed to work up to a goal of 150 minutes of combined cardio and strengthening exercise per week for weight loss and overall health benefits.  Patient is agreeable to focus on food intake quantity and choice for the next few weeks until next appointment then discuss adding in physical activity.   Behavior Modification:  We discussed the following Behavioral Modification Strategies today: increasing lean protein intake, increasing vegetables, meal planning and cooking strategies, better snacking choices, avoiding temptations, and planning for success.  Return in about  4 weeks (around 08/29/2023).Megan Bryant She was informed of the importance of frequent follow up visits to maximize her success with intensive lifestyle modifications for her multiple health conditions.  Attestation Statements:   Reviewed by clinician on day of visit: allergies, medications, problem list, medical history, surgical history, family history, social history, and previous encounter notes.   Time spent on visit including pre-visit chart review and post-visit care and charting was 30 minutes.    Megan Cho, MD

## 2023-08-01 NOTE — Assessment & Plan Note (Addendum)
 A1c improved from 6.2 to 5.9.  We did discuss the importance of monitoring nutrition in terms of calorie to protein ratio.  She wants to be more mindful in food choices and meal planning and prepping. Plan to follow up in 4 weeks to refocus on how patient has done with the goals of prep and planning.  Recent labs discussed today.

## 2023-08-16 DIAGNOSIS — G4733 Obstructive sleep apnea (adult) (pediatric): Secondary | ICD-10-CM | POA: Diagnosis not present

## 2023-09-05 ENCOUNTER — Ambulatory Visit (INDEPENDENT_AMBULATORY_CARE_PROVIDER_SITE_OTHER): Payer: 59 | Admitting: Family Medicine

## 2023-09-05 ENCOUNTER — Encounter (INDEPENDENT_AMBULATORY_CARE_PROVIDER_SITE_OTHER): Payer: Self-pay | Admitting: Family Medicine

## 2023-09-05 VITALS — BP 117/67 | HR 65 | Temp 98.0°F | Ht 70.0 in | Wt 221.0 lb

## 2023-09-05 DIAGNOSIS — E782 Mixed hyperlipidemia: Secondary | ICD-10-CM

## 2023-09-05 DIAGNOSIS — E66811 Obesity, class 1: Secondary | ICD-10-CM | POA: Diagnosis not present

## 2023-09-05 DIAGNOSIS — E559 Vitamin D deficiency, unspecified: Secondary | ICD-10-CM

## 2023-09-05 DIAGNOSIS — Z6834 Body mass index (BMI) 34.0-34.9, adult: Secondary | ICD-10-CM

## 2023-09-05 DIAGNOSIS — Z6831 Body mass index (BMI) 31.0-31.9, adult: Secondary | ICD-10-CM

## 2023-09-05 MED ORDER — VITAMIN D (ERGOCALCIFEROL) 1.25 MG (50000 UNIT) PO CAPS: 50000.0000 [IU] | ORAL_CAPSULE | ORAL | 0 refills | Status: AC

## 2023-09-05 NOTE — Progress Notes (Signed)
SUBJECTIVE:  Chief Complaint: Obesity  Interim History: First few weeks after her last appointment were a struggle but last week she meal prepped and planned and yesterday she prepped and planned and she feels this has really helped keep her on track.  She was anticipating doing better than she did.  She is getting enough of her total food in and feels good about content and quantity of food.  No upcoming plans for the rest of this week or month. Breakfast she does very well with.  Megan Bryant is here to discuss her progress with her obesity treatment plan. She is on the Category 3 Plan and states she is following her eating plan approximately 75 % of the time. She states she is exercising 120 minutes 2 times, and weights 20 minutes 3 times per week.    OBJECTIVE: Visit Diagnoses: Problem List Items Addressed This Visit       Other   Vitamin D deficiency   Patient doing well on prescription strength vitamin D.  She needs a refill of that medication today.      Relevant Medications   Vitamin D, Ergocalciferol, (DRISDOL) 1.25 MG (50000 UNIT) CAPS capsule   Class 1 obesity with serious comorbidity and body mass index (BMI) of 34.0 to 34.9 in adult - Primary   Starting weight: 239 on 12/22/22 Peak weight:  BMR: 1800 on 12/22/22 Previous obesity management: tried combination of phentermine and topiramate but couldn't tolerate anything past 4/25 due to palpitations and mood changes Body Fat %: 41.9% Starting Meal Plan: Category 3 Meal Plan needs: likes to incorporate more salmon, thinks chicken is difficult to flavor      Mixed hyperlipidemia   Last labs done at the beginning of December 2024 still showing an elevated LDL cholesterol.  Patient realizes she has been more indulgent over the holidays and has gotten more consistently back on track since that time.  Will continue with current food plan and anticipate repeat labs in March or April of this year.         09/05/2023    8:00 AM  08/01/2023    9:00 AM 06/29/2023    9:00 AM  Vitals with BMI  Height 5\' 10"  5\' 10"  5\' 10"   Weight 221 lbs 218 lbs 217 lbs  BMI 31.71 31.28 31.14  Systolic 117 129 578  Diastolic 67 75 64  Pulse 65 75 69    No data recorded  No data recorded  No data recorded  No data recorded    ASSESSMENT AND PLAN:  Diet: Christalyn is currently in the action stage of change. As such, her goal is to continue with weight loss efforts. She has agreed to Category 3 Plan.  Exercise: Kathreen has been instructed to work up to a goal of 150 minutes of combined cardio and strengthening exercise per week for weight loss and overall health benefits.  Wants to stay consistent with her physical activity over the next few weeks at home- she wants to increase her pickleball playing as the weather warms up.   Behavior Modification:  We discussed the following Behavioral Modification Strategies today: increasing lean protein intake, increasing vegetables, meal planning and cooking strategies, and better snacking choices.   Return in about 3 weeks (around 09/26/2023).Marland Kitchen She was informed of the importance of frequent follow up visits to maximize her success with intensive lifestyle modifications for her multiple health conditions.  Attestation Statements:   Reviewed by clinician on day of visit: allergies, medications, problem  list, medical history, surgical history, family history, social history, and previous encounter notes.      Reuben Likes, MD

## 2023-09-05 NOTE — Assessment & Plan Note (Signed)
 Starting weight: 239 on 12/22/22 Peak weight:  BMR: 1800 on 12/22/22 Previous obesity management: tried combination of phentermine and topiramate  but couldn't tolerate anything past 4/25 due to palpitations and mood changes Body Fat %: 41.9% Starting Meal Plan: Category 3 Meal Plan needs: likes to incorporate more salmon, thinks chicken is difficult to flavor

## 2023-09-12 NOTE — Assessment & Plan Note (Signed)
Patient doing well on prescription strength vitamin D.  She needs a refill of that medication today.

## 2023-09-12 NOTE — Assessment & Plan Note (Signed)
Last labs done at the beginning of December 2024 still showing an elevated LDL cholesterol.  Patient realizes she has been more indulgent over the holidays and has gotten more consistently back on track since that time.  Will continue with current food plan and anticipate repeat labs in March or April of this year.

## 2023-10-03 ENCOUNTER — Encounter (INDEPENDENT_AMBULATORY_CARE_PROVIDER_SITE_OTHER): Payer: Self-pay | Admitting: Family Medicine

## 2023-10-03 ENCOUNTER — Ambulatory Visit (INDEPENDENT_AMBULATORY_CARE_PROVIDER_SITE_OTHER): Payer: 59 | Admitting: Family Medicine

## 2023-10-03 VITALS — BP 122/67 | HR 63 | Temp 97.6°F | Ht 70.0 in | Wt 225.0 lb

## 2023-10-03 DIAGNOSIS — E559 Vitamin D deficiency, unspecified: Secondary | ICD-10-CM | POA: Diagnosis not present

## 2023-10-03 DIAGNOSIS — R350 Frequency of micturition: Secondary | ICD-10-CM | POA: Diagnosis not present

## 2023-10-03 DIAGNOSIS — E669 Obesity, unspecified: Secondary | ICD-10-CM

## 2023-10-03 DIAGNOSIS — Z6832 Body mass index (BMI) 32.0-32.9, adult: Secondary | ICD-10-CM

## 2023-10-03 DIAGNOSIS — R3989 Other symptoms and signs involving the genitourinary system: Secondary | ICD-10-CM | POA: Diagnosis not present

## 2023-10-03 DIAGNOSIS — G4730 Sleep apnea, unspecified: Secondary | ICD-10-CM | POA: Insufficient documentation

## 2023-10-03 DIAGNOSIS — R3915 Urgency of urination: Secondary | ICD-10-CM | POA: Diagnosis not present

## 2023-10-03 DIAGNOSIS — G4733 Obstructive sleep apnea (adult) (pediatric): Secondary | ICD-10-CM

## 2023-10-03 DIAGNOSIS — G478 Other sleep disorders: Secondary | ICD-10-CM

## 2023-10-03 DIAGNOSIS — F5089 Other specified eating disorder: Secondary | ICD-10-CM

## 2023-10-03 DIAGNOSIS — E66811 Obesity, class 1: Secondary | ICD-10-CM

## 2023-10-03 MED ORDER — TOPIRAMATE 25 MG PO TABS: 25.0000 mg | ORAL_TABLET | Freq: Every day | ORAL | 0 refills | Status: DC

## 2023-10-03 MED ORDER — ZEPBOUND 2.5 MG/0.5ML ~~LOC~~ SOAJ: 2.5000 mg | SUBCUTANEOUS | 0 refills | Status: DC

## 2023-10-03 NOTE — Progress Notes (Signed)
 SUBJECTIVE:  Chief Complaint: Obesity  Interim History: Patient feeling overwhelmed and she mentions that work, kids, husband, home renovations have really stressed her out.  She works [employer] and she is on the road and working late and tends to eat off plan when she gets home. She is eating the meals she prepared for supper then snacks afterwards- pita chips, chips, ice cream.  She thinks she is eating enough during the day- eating sandwich, yogurt and apple for lunch and eggs and bread for breakfast.  Sometimes she does get the desire to eat around 4:30-5.  She feels like she got to the 20-25lb mark and now feels frustrated.   Megan Bryant is here to discuss her progress with her obesity treatment plan. She is on the Category 3 Plan and states she is following her eating plan approximately 40 % of the time. She states she is not exercising.   OBJECTIVE: Visit Diagnoses: Problem List Items Addressed This Visit       Respiratory   Sleep apnea   Severe- sleep study report: Calculated pAHI (per hour):    33.9/hour                      REM pAHI:    55.2/hour                       NREM pAHI:29.7/hour Supine AHI: 35.4 /hour Non Supine AHI was 23/h.   Oxygen Saturation Statistics:    Oxygen Saturation (%) Mean:               93%   Minimum oxygen saturation (%):          58%   O2 Saturation Range (%):                  58-99%                        O2 Saturation (minutes) <89%:28.7 min  Discussed Zepbound approval for treatment of sleep apnea and she is agreeable to see if insurance will cover this.  Risks and benefits discussed today.  Possible side effects discussed today.  Patient is agreeable to see if Zepbound is covered.  2.5mg  dose sent in.        Other   Nocturnal sleep-related eating disorder - Primary   Patient has increased snack in the evening.  We discussed off label use of topiramate to help with decreasing overall carbohydrate intake at this time.  Risks and benefits  were discussed as well as possible side effects.  Will start topiramate 25mg  at dinnertime daily.      Relevant Medications   topiramate (TOPAMAX) 25 MG tablet   Vitamin D deficiency   On prescription strength Vitamin D.  No nausea, vomiting or muscle weakness.  Needs a refill of her prescription today.      Other Visit Diagnoses       BMI 32.0-32.9,adult       Relevant Medications   tirzepatide (ZEPBOUND) 2.5 MG/0.5ML Pen       Vitals Temp: 97.6 F (36.4 C) BP: 122/67 Pulse Rate: 63 SpO2: 100 %   Anthropometric Measurements Height: 5\' 10"  (1.778 m) Weight: 225 lb (102.1 kg) BMI (Calculated): 32.28 Weight at Last Visit: 221 lb Weight Lost Since Last Visit: 0 Weight Gained Since Last Visit: 4 Starting Weight: 239 lb Total Weight Loss (lbs): 14 lb (6.35 kg)   Body  Composition  Body Fat %: 41.5 % Fat Mass (lbs): 93.6 lbs Muscle Mass (lbs): 125.2 lbs Total Body Water (lbs): 86.4 lbs Visceral Fat Rating : 11   Other Clinical Data Fasting: no Labs: no Today's Visit #: 12 Starting Date: 12/22/22 Comments: Cat 3     ASSESSMENT AND PLAN:  Diet: Megan Bryant is currently in the action stage of change. As such, her goal is to continue with weight loss efforts and has agreed to the Category 3 Plan.   Exercise:  For substantial health benefits, adults should do at least 150 minutes (2 hours and 30 minutes) a week of moderate-intensity, or 75 minutes (1 hour and 15 minutes) a week of vigorous-intensity aerobic physical activity, or an equivalent combination of moderate- and vigorous-intensity aerobic activity. Aerobic activity should be performed in episodes of at least 10 minutes, and preferably, it should be spread throughout the week.  Behavior Modification:  We discussed the following Behavioral Modification Strategies today: increasing lean protein intake, increasing vegetables, ways to avoid nighttime snacking, better snacking choices, and emotional eating  strategies .   No follow-ups on file.Marland Kitchen She was informed of the importance of frequent follow up visits to maximize her success with intensive lifestyle modifications for her multiple health conditions.  Attestation Statements:   Reviewed by clinician on day of visit: allergies, medications, problem list, medical history, surgical history, family history, social history, and previous encounter notes.    Reuben Likes, MD

## 2023-10-03 NOTE — Assessment & Plan Note (Signed)
 Patient has increased snack in the evening.  We discussed off label use of topiramate to help with decreasing overall carbohydrate intake at this time.  Risks and benefits were discussed as well as possible side effects.  Will start topiramate 25mg  at dinnertime daily.

## 2023-10-03 NOTE — Assessment & Plan Note (Signed)
 Severe- sleep study report: Calculated pAHI (per hour):    33.9/hour                      REM pAHI:    55.2/hour                       NREM pAHI:29.7/hour Supine AHI: 35.4 /hour Non Supine AHI was 23/h.   Oxygen Saturation Statistics:    Oxygen Saturation (%) Mean:               93%   Minimum oxygen saturation (%):          58%   O2 Saturation Range (%):                  58-99%                        O2 Saturation (minutes) <89%:28.7 min  Discussed Zepbound approval for treatment of sleep apnea and she is agreeable to see if insurance will cover this.  Risks and benefits discussed today.  Possible side effects discussed today.  Patient is agreeable to see if Zepbound is covered.  2.5mg  dose sent in.

## 2023-10-03 NOTE — Assessment & Plan Note (Signed)
 On prescription strength Vitamin D.  No nausea, vomiting or muscle weakness.  Needs a refill of her prescription today.

## 2023-10-04 ENCOUNTER — Other Ambulatory Visit (INDEPENDENT_AMBULATORY_CARE_PROVIDER_SITE_OTHER): Payer: Self-pay | Admitting: Family Medicine

## 2023-10-04 DIAGNOSIS — E559 Vitamin D deficiency, unspecified: Secondary | ICD-10-CM

## 2023-10-12 DIAGNOSIS — G4733 Obstructive sleep apnea (adult) (pediatric): Secondary | ICD-10-CM | POA: Diagnosis not present

## 2023-10-26 ENCOUNTER — Other Ambulatory Visit: Payer: Self-pay | Admitting: Internal Medicine

## 2023-10-26 DIAGNOSIS — Z1231 Encounter for screening mammogram for malignant neoplasm of breast: Secondary | ICD-10-CM

## 2023-10-31 ENCOUNTER — Encounter (INDEPENDENT_AMBULATORY_CARE_PROVIDER_SITE_OTHER): Payer: Self-pay | Admitting: Family Medicine

## 2023-10-31 ENCOUNTER — Ambulatory Visit (INDEPENDENT_AMBULATORY_CARE_PROVIDER_SITE_OTHER): Payer: 59 | Admitting: Family Medicine

## 2023-10-31 VITALS — BP 130/67 | HR 71 | Temp 97.7°F | Ht 70.0 in | Wt 226.0 lb

## 2023-10-31 DIAGNOSIS — E66811 Obesity, class 1: Secondary | ICD-10-CM

## 2023-10-31 DIAGNOSIS — E782 Mixed hyperlipidemia: Secondary | ICD-10-CM

## 2023-10-31 DIAGNOSIS — Z6832 Body mass index (BMI) 32.0-32.9, adult: Secondary | ICD-10-CM

## 2023-10-31 DIAGNOSIS — G4730 Sleep apnea, unspecified: Secondary | ICD-10-CM

## 2023-10-31 DIAGNOSIS — G4733 Obstructive sleep apnea (adult) (pediatric): Secondary | ICD-10-CM

## 2023-10-31 NOTE — Assessment & Plan Note (Signed)
 Last appointment Zepbound sent in but unclear as to why insurance did not cover as patient has severe OSA and is on CPAP.  She is being more consistent with her usage and length of usage.  She would benefit significantly from Zepbound usage as adjunct treatment for severe OSA.   Severe- sleep study report: Calculated pAHI (per hour):    33.9/hour                      REM pAHI:    55.2/hour                       NREM pAHI:29.7/hour Supine AHI: 35.4 /hour Non Supine AHI was 23/h.   Oxygen Saturation Statistics:    Oxygen Saturation (%) Mean:               93%   Minimum oxygen saturation (%):          58%   O2 Saturation Range (%):                  58-99%                        O2 Saturation (minutes) <89%:28.7 min

## 2023-10-31 NOTE — Progress Notes (Signed)
 SUBJECTIVE:  Chief Complaint: Obesity  Interim History: Patient is feeling like she lost her motivation.  She didn't notice a change in her intake with the topiramate.  Insurance did not cover zepbound.  She still is struggling with ice cream intake in the evening.  She feels like she is thinking about food constantly.  Feels this dominates what she thinks about all day.  She mentions this is frustrating.  She isn't sleeping well.  She is working 6-7 days a week.  Unknown is here to discuss her progress with her obesity treatment plan. She is on the Category 3 Plan and states she is following her eating plan approximately 25 % of the time. She states she is exercising 20-25 minutes 3 times per week.   OBJECTIVE: Visit Diagnoses: Problem List Items Addressed This Visit       Respiratory   Sleep apnea - Primary   Last appointment Zepbound sent in but unclear as to why insurance did not cover as patient has severe OSA and is on CPAP.  She is being more consistent with her usage and length of usage.  She would benefit significantly from Zepbound usage as adjunct treatment for severe OSA.   Severe- sleep study report: Calculated pAHI (per hour):    33.9/hour                      REM pAHI:    55.2/hour                       NREM pAHI:29.7/hour Supine AHI: 35.4 /hour Non Supine AHI was 23/h.   Oxygen Saturation Statistics:    Oxygen Saturation (%) Mean:               93%   Minimum oxygen saturation (%):          58%   O2 Saturation Range (%):                  58-99%                        O2 Saturation (minutes) <89%:28.7 min         Other   Class 1 obesity with serious comorbidity and body mass index (BMI) of 34.0 to 34.9 in adult   Mixed hyperlipidemia   Last LDL 144 from December 2024.  Patient has been struggling with food choices and quantity recently.  Repeat labs with PCP in 1 month.      Other Visit Diagnoses       BMI 32.0-32.9,adult           Vitals Temp: 97.7  F (36.5 C) BP: 130/67 Pulse Rate: 71 SpO2: 99 %   Anthropometric Measurements Height: 5\' 10"  (1.778 m) Weight: 226 lb (102.5 kg) BMI (Calculated): 32.43 Weight at Last Visit: 225 lb Weight Lost Since Last Visit: 0 Weight Gained Since Last Visit: 1 Starting Weight: 239 lb Total Weight Loss (lbs): 13 lb (5.897 kg)   Body Composition  Body Fat %: 41.8 % Fat Mass (lbs): 94.4 lbs Muscle Mass (lbs): 125 lbs Total Body Water (lbs): 85.8 lbs Visceral Fat Rating : 11   Other Clinical Data Today's Visit #: 13 Starting Date: 12/22/22 Comments: Cat 3     ASSESSMENT AND PLAN:  Diet: Zyanya is currently in the action stage of change. As such, her goal is to continue with weight loss efforts and has  agreed to practicing portion control and making smarter food choices, such as increasing vegetables and decreasing simple carbohydrates.   Exercise:  All adults should avoid inactivity. Some activity is better than none, and adults who participate in any amount of physical activity, gain some health benefits.  Behavior Modification:  We discussed the following Behavioral Modification Strategies today: increasing lean protein intake, decreasing simple carbohydrates, meal planning and cooking strategies, keeping healthy foods in the home, ways to avoid nighttime snacking, and avoiding temptations.   Return in about 3 months (around 01/30/2024).Marland Kitchen She was informed of the importance of frequent follow up visits to maximize her success with intensive lifestyle modifications for her multiple health conditions.  Attestation Statements:   Reviewed by clinician on day of visit: allergies, medications, problem list, medical history, surgical history, family history, social history, and previous encounter notes.   Reuben Likes, MD

## 2023-10-31 NOTE — Assessment & Plan Note (Signed)
 Last LDL 144 from December 2024.  Patient has been struggling with food choices and quantity recently.  Repeat labs with PCP in 1 month.

## 2023-11-02 ENCOUNTER — Encounter (INDEPENDENT_AMBULATORY_CARE_PROVIDER_SITE_OTHER): Payer: Self-pay

## 2023-11-21 ENCOUNTER — Ambulatory Visit (INDEPENDENT_AMBULATORY_CARE_PROVIDER_SITE_OTHER): Admitting: Family Medicine

## 2023-11-23 ENCOUNTER — Ambulatory Visit
Admission: RE | Admit: 2023-11-23 | Discharge: 2023-11-23 | Disposition: A | Discharge status: Home / Self Care | Source: Ambulatory Visit | Attending: Internal Medicine | Admitting: Internal Medicine

## 2023-11-23 DIAGNOSIS — Z1231 Encounter for screening mammogram for malignant neoplasm of breast: Secondary | ICD-10-CM

## 2023-11-24 DIAGNOSIS — Z1212 Encounter for screening for malignant neoplasm of rectum: Secondary | ICD-10-CM | POA: Diagnosis not present

## 2023-11-24 DIAGNOSIS — E538 Deficiency of other specified B group vitamins: Secondary | ICD-10-CM | POA: Diagnosis not present

## 2023-11-28 ENCOUNTER — Other Ambulatory Visit: Payer: Self-pay | Admitting: Internal Medicine

## 2023-11-28 DIAGNOSIS — R928 Other abnormal and inconclusive findings on diagnostic imaging of breast: Secondary | ICD-10-CM

## 2023-12-01 DIAGNOSIS — Z1339 Encounter for screening examination for other mental health and behavioral disorders: Secondary | ICD-10-CM | POA: Diagnosis not present

## 2023-12-01 DIAGNOSIS — E559 Vitamin D deficiency, unspecified: Secondary | ICD-10-CM | POA: Diagnosis not present

## 2023-12-01 DIAGNOSIS — M1990 Unspecified osteoarthritis, unspecified site: Secondary | ICD-10-CM | POA: Diagnosis not present

## 2023-12-01 DIAGNOSIS — E538 Deficiency of other specified B group vitamins: Secondary | ICD-10-CM | POA: Diagnosis not present

## 2023-12-01 DIAGNOSIS — Z1331 Encounter for screening for depression: Secondary | ICD-10-CM | POA: Diagnosis not present

## 2023-12-01 DIAGNOSIS — L309 Dermatitis, unspecified: Secondary | ICD-10-CM | POA: Diagnosis not present

## 2023-12-01 DIAGNOSIS — G4733 Obstructive sleep apnea (adult) (pediatric): Secondary | ICD-10-CM | POA: Diagnosis not present

## 2023-12-01 DIAGNOSIS — G4700 Insomnia, unspecified: Secondary | ICD-10-CM | POA: Diagnosis not present

## 2023-12-01 DIAGNOSIS — E786 Lipoprotein deficiency: Secondary | ICD-10-CM | POA: Diagnosis not present

## 2023-12-01 DIAGNOSIS — E669 Obesity, unspecified: Secondary | ICD-10-CM | POA: Diagnosis not present

## 2023-12-01 DIAGNOSIS — R5383 Other fatigue: Secondary | ICD-10-CM | POA: Diagnosis not present

## 2023-12-05 ENCOUNTER — Encounter (HOSPITAL_COMMUNITY): Payer: Self-pay

## 2023-12-12 ENCOUNTER — Encounter

## 2023-12-12 ENCOUNTER — Other Ambulatory Visit

## 2023-12-26 ENCOUNTER — Ambulatory Visit
Admission: RE | Admit: 2023-12-26 | Discharge: 2023-12-26 | Disposition: A | Discharge status: Home / Self Care | Source: Ambulatory Visit | Attending: Internal Medicine | Admitting: Internal Medicine

## 2023-12-26 DIAGNOSIS — R928 Other abnormal and inconclusive findings on diagnostic imaging of breast: Secondary | ICD-10-CM

## 2023-12-26 DIAGNOSIS — N6311 Unspecified lump in the right breast, upper outer quadrant: Secondary | ICD-10-CM | POA: Diagnosis not present

## 2023-12-27 ENCOUNTER — Other Ambulatory Visit: Payer: Self-pay | Admitting: Internal Medicine

## 2023-12-27 DIAGNOSIS — N6310 Unspecified lump in the right breast, unspecified quadrant: Secondary | ICD-10-CM

## 2023-12-30 ENCOUNTER — Ambulatory Visit
Admission: RE | Admit: 2023-12-30 | Discharge: 2023-12-30 | Disposition: A | Discharge status: Home / Self Care | Source: Ambulatory Visit | Attending: Internal Medicine | Admitting: Internal Medicine

## 2023-12-30 ENCOUNTER — Other Ambulatory Visit: Payer: Self-pay | Admitting: General Practice

## 2023-12-30 DIAGNOSIS — N6310 Unspecified lump in the right breast, unspecified quadrant: Secondary | ICD-10-CM

## 2023-12-30 DIAGNOSIS — R92321 Mammographic fibroglandular density, right breast: Secondary | ICD-10-CM | POA: Diagnosis not present

## 2023-12-30 DIAGNOSIS — N62 Hypertrophy of breast: Secondary | ICD-10-CM | POA: Diagnosis not present

## 2023-12-30 DIAGNOSIS — N6311 Unspecified lump in the right breast, upper outer quadrant: Secondary | ICD-10-CM | POA: Diagnosis not present

## 2023-12-30 HISTORY — PX: BREAST BIOPSY: SHX20

## 2024-01-02 LAB — SURGICAL PATHOLOGY

## 2024-01-23 DIAGNOSIS — G4733 Obstructive sleep apnea (adult) (pediatric): Secondary | ICD-10-CM | POA: Diagnosis not present

## 2024-03-05 ENCOUNTER — Ambulatory Visit (INDEPENDENT_AMBULATORY_CARE_PROVIDER_SITE_OTHER): Admitting: Family Medicine

## 2024-03-07 NOTE — Progress Notes (Unsigned)
   PATIENT: Megan Bryant DOB: September 30, 1965  REASON FOR VISIT: follow up HISTORY FROM: patient  Virtual Visit via MyChart video  I connected with Megan Bryant on 03/07/24 at  8:30 AM EDT via MyChart video and verified that I am speaking with the correct person using two identifiers.   I discussed the limitations, risks, security and privacy concerns of performing an evaluation and management service by Mychart video and the availability of in person appointments. I also discussed with the patient that there may be a patient responsible charge related to this service. The patient expressed understanding and agreed to proceed.   History of Present Illness:  03/07/24 ALL (MyChart): Megan Bryant is a 58 y.o. female here today for follow up for OSA on CPAP. She was last seen 02/2023 and doing well.   Compliance??     Observations/Objective:  Generalized: Well developed, in no acute distress  Mentation: Alert oriented to time, place, history taking. Follows all commands speech and language fluent   Assessment and Plan:  58 y.o. year old female  has a past medical history of Anxiety, Bilateral swelling of feet and ankles, Chest pain, Depression, Dry skin, Fatigue, Gallbladder problem, Headache, Heartburn, High cholesterol, Hypertension, Insomnia, Joint pain, Knee pain, Lactose intolerance, Low back pain, Palpitations, Skin rash, Sleep apnea, and SOB (shortness of breath). here with  No diagnosis found.  No orders of the defined types were placed in this encounter.   No orders of the defined types were placed in this encounter.    Follow Up Instructions:  I discussed the assessment and treatment plan with the patient. The patient was provided an opportunity to ask questions and all were answered. The patient agreed with the plan and demonstrated an understanding of the instructions.   The patient was advised to call back or seek an in-person evaluation if the  symptoms worsen or if the condition fails to improve as anticipated.  I provided *** minutes of face-to-face and non face-to-face time during this MyChart video encounter. Patient located at their place of residence. Provider is in the office.    Alexio Sroka, NP

## 2024-03-07 NOTE — Patient Instructions (Incomplete)
 Please continue using your CPAP regularly. While your insurance requires that you use CPAP at least 4 hours each night on 70% of the nights, I recommend, that you not skip any nights and use it throughout the night if you can. Getting used to CPAP and staying with the treatment long term does take time and patience and discipline. Untreated obstructive sleep apnea when it is moderate to severe can have an adverse impact on cardiovascular health and raise her risk for heart disease, arrhythmias, hypertension, congestive heart failure, stroke and diabetes. Untreated obstructive sleep apnea causes sleep disruption, nonrestorative sleep, and sleep deprivation. This can have an impact on your day to day functioning and cause daytime sleepiness and impairment of cognitive function, memory loss, mood disturbance, and problems focussing. Using CPAP regularly can improve these symptoms.  We will update supply orders, today. Discuss data reporting and any concerns with headgear with [employer]. Consider increasing melatonin to 10mg  nightly for 1-2 weeks then you can return to 5mg  at bedtime.   Follow up in 1 year

## 2024-03-07 NOTE — Progress Notes (Unsigned)
 SABRA

## 2024-03-08 ENCOUNTER — Telehealth: Payer: 59 | Admitting: Family Medicine

## 2024-03-12 ENCOUNTER — Ambulatory Visit (INDEPENDENT_AMBULATORY_CARE_PROVIDER_SITE_OTHER): Admitting: Family Medicine

## 2024-03-12 ENCOUNTER — Telehealth: Payer: Self-pay

## 2024-03-12 VITALS — BP 103/67 | HR 71 | Temp 97.6°F | Ht 70.0 in | Wt 223.0 lb

## 2024-03-12 DIAGNOSIS — Z6832 Body mass index (BMI) 32.0-32.9, adult: Secondary | ICD-10-CM | POA: Diagnosis not present

## 2024-03-12 DIAGNOSIS — G478 Other sleep disorders: Secondary | ICD-10-CM

## 2024-03-12 DIAGNOSIS — E66811 Obesity, class 1: Secondary | ICD-10-CM

## 2024-03-12 NOTE — Progress Notes (Unsigned)
   SUBJECTIVE:  Chief Complaint: Obesity  Interim History: Patient here for first time since early April.  She started Optavia in early July and lost a significant amount of weight on this.  She feels it is working for her.  She likes the structure of the program and the support and likes she can eat real food.  Here drive to snack at night is gone.  If she is hungry at night she will eat a cup of yogurt or an egg.  Megan Bryant is here to discuss her progress with her obesity treatment plan. She is on the practicing portion control and making smarter food choices, such as increasing vegetables and decreasing simple carbohydrates and states she is following her eating plan approximately 25 % of the time. She states she is walking for 1 week.   OBJECTIVE: Visit Diagnoses: Problem List Items Addressed This Visit   None   Vitals Temp: 97.6 F (36.4 C) BP: 103/67 Pulse Rate: 71 SpO2: 99 %   Anthropometric Measurements Height: 5' 10 (1.778 m) Weight: 223 lb (101.2 kg) BMI (Calculated): 32 Weight at Last Visit: 226 lb Weight Lost Since Last Visit: 3 Weight Gained Since Last Visit: 0 Starting Weight: 239 lb Total Weight Loss (lbs): 16 lb (7.258 kg)   Body Composition  Body Fat %: 40.8 % Fat Mass (lbs): 91 lbs Muscle Mass (lbs): 125.6 lbs Total Body Water (lbs): 84.2 lbs Visceral Fat Rating : 11   Other Clinical Data Fasting: yes Labs: no Today's Visit #: 14 Starting Date: 12/22/22 Comments: PC/     ASSESSMENT AND PLAN:  Diet: Megan Bryant is currently in the action stage of change. As such, her goal is to continue with weight loss efforts and has agreed to using a prepackaged eating plan.   Exercise:  For substantial health benefits, adults should do at least 150 minutes (2 hours and 30 minutes) a week of moderate-intensity, or 75 minutes (1 hour and 15 minutes) a week of vigorous-intensity aerobic physical activity, or an equivalent combination of moderate- and  vigorous-intensity aerobic activity. Aerobic activity should be performed in episodes of at least 10 minutes, and preferably, it should be spread throughout the week.  Behavior Modification:  We discussed the following Behavioral Modification Strategies today: increasing lean protein intake, decreasing simple carbohydrates, increasing vegetables, meal planning and cooking strategies, and keeping healthy foods in the home. We discussed various medication options to help Megan Bryant with her weight loss efforts and we both agreed to continue optavia and only follow up if needed.  No follow-ups on file.   She was informed of the importance of frequent follow up visits to maximize her success with intensive lifestyle modifications for her multiple health conditions.  Attestation Statements:   Reviewed by clinician on day of visit: allergies, medications, problem list, medical history, surgical history, family history, social history, and previous encounter notes.    Megan Cho, MD

## 2024-03-12 NOTE — Telephone Encounter (Signed)
 Please see Mychart message from today 03/12/2024

## 2024-03-13 ENCOUNTER — Encounter: Payer: Self-pay | Admitting: Family Medicine

## 2024-03-13 ENCOUNTER — Telehealth (INDEPENDENT_AMBULATORY_CARE_PROVIDER_SITE_OTHER): Admitting: Family Medicine

## 2024-03-13 DIAGNOSIS — G4733 Obstructive sleep apnea (adult) (pediatric): Secondary | ICD-10-CM | POA: Diagnosis not present

## 2024-03-13 NOTE — Assessment & Plan Note (Signed)
 Feeling better control with her nighttime eating on current Optavia diet.  Will continue using Optavia supplementation and meals

## 2024-06-11 ENCOUNTER — Encounter: Payer: Self-pay | Admitting: Family Medicine

## 2024-06-11 ENCOUNTER — Other Ambulatory Visit: Payer: Self-pay | Admitting: Family Medicine

## 2024-06-11 DIAGNOSIS — G4733 Obstructive sleep apnea (adult) (pediatric): Secondary | ICD-10-CM

## 2024-06-11 NOTE — Telephone Encounter (Addendum)
 Printed Download placed on Amy,NP to review and give recommendation

## 2024-07-04 ENCOUNTER — Other Ambulatory Visit: Payer: Self-pay | Admitting: Internal Medicine

## 2024-07-04 DIAGNOSIS — Z09 Encounter for follow-up examination after completed treatment for conditions other than malignant neoplasm: Secondary | ICD-10-CM

## 2024-07-14 ENCOUNTER — Other Ambulatory Visit: Payer: Self-pay | Admitting: Internal Medicine

## 2024-07-14 DIAGNOSIS — Z09 Encounter for follow-up examination after completed treatment for conditions other than malignant neoplasm: Secondary | ICD-10-CM

## 2024-07-14 DIAGNOSIS — N6489 Other specified disorders of breast: Secondary | ICD-10-CM

## 2024-07-16 ENCOUNTER — Other Ambulatory Visit: Payer: Self-pay | Admitting: Internal Medicine

## 2024-07-16 DIAGNOSIS — N6489 Other specified disorders of breast: Secondary | ICD-10-CM

## 2024-07-25 ENCOUNTER — Ambulatory Visit
Admission: RE | Admit: 2024-07-25 | Discharge: 2024-07-25 | Disposition: A | Discharge status: Home / Self Care | Source: Ambulatory Visit | Attending: Internal Medicine | Admitting: Internal Medicine

## 2024-07-25 ENCOUNTER — Ambulatory Visit

## 2024-07-25 DIAGNOSIS — N6489 Other specified disorders of breast: Secondary | ICD-10-CM

## 2025-03-18 ENCOUNTER — Ambulatory Visit: Admitting: Family Medicine
